# Patient Record
Sex: Female | Born: 1960 | Race: White | Hispanic: No | Marital: Married | Smoking: Former smoker
Health system: Southern US, Community
[De-identification: ages and names within clinical notes are randomized; demographics above are authoritative.]

## PROBLEM LIST (undated history)

## (undated) DIAGNOSIS — I10 Essential (primary) hypertension: Secondary | ICD-10-CM

## (undated) DIAGNOSIS — M1990 Unspecified osteoarthritis, unspecified site: Secondary | ICD-10-CM

## (undated) DIAGNOSIS — K649 Unspecified hemorrhoids: Secondary | ICD-10-CM

## (undated) HISTORY — PX: TUBAL LIGATION: SHX77

## (undated) HISTORY — DX: Unspecified hemorrhoids: K64.9

## (undated) HISTORY — PX: HEMORRHOID SURGERY: SHX153

## (undated) HISTORY — PX: CHOLECYSTECTOMY: SHX55

## (undated) HISTORY — PX: DILATION AND CURETTAGE OF UTERUS: SHX78

## (undated) HISTORY — PX: TONSILLECTOMY: SUR1361

---

## 2009-04-14 ENCOUNTER — Emergency Department: Payer: Self-pay | Admitting: Emergency Medicine

## 2009-04-22 ENCOUNTER — Ambulatory Visit: Payer: Self-pay

## 2009-04-24 ENCOUNTER — Ambulatory Visit: Payer: Self-pay

## 2011-06-21 ENCOUNTER — Ambulatory Visit: Payer: Self-pay | Admitting: Family Medicine

## 2013-01-28 ENCOUNTER — Observation Stay: Payer: Self-pay | Admitting: Surgery

## 2013-01-29 LAB — CBC WITH DIFFERENTIAL/PLATELET
Basophil #: 0.1 10*3/uL (ref 0.0–0.1)
Basophil %: 0.8 %
Eosinophil %: 4.7 %
HCT: 35.9 % (ref 35.0–47.0)
HGB: 12.5 g/dL (ref 12.0–16.0)
Lymphocyte %: 29.2 %
MCV: 94 fL (ref 80–100)
Neutrophil %: 55.8 %
Platelet: 222 10*3/uL (ref 150–440)
RDW: 12.9 % (ref 11.5–14.5)

## 2013-01-29 LAB — BASIC METABOLIC PANEL
Anion Gap: 3 — ABNORMAL LOW (ref 7–16)
BUN: 14 mg/dL (ref 7–18)
Calcium, Total: 8.4 mg/dL — ABNORMAL LOW (ref 8.5–10.1)
EGFR (African American): >60
Glucose: 107 mg/dL — ABNORMAL HIGH (ref 65–99)
Osmolality: 280 (ref 275–301)
Potassium: 3.7 mmol/L (ref 3.5–5.1)
Sodium: 140 mmol/L (ref 136–145)

## 2013-02-08 ENCOUNTER — Ambulatory Visit: Payer: Self-pay | Admitting: Family Medicine

## 2014-08-16 NOTE — Discharge Summary (Signed)
PATIENT NAME:  Alexis Allen, Alexis Allen MR#:  [id number] DATE OF BIRTH:  10/24/1960  DATE OF ADMISSION:  01/28/2013 DATE OF DISCHARGE:  01/31/2013  FINAL DIAGNOSIS: Thrombosed prolapsed internal and external hemorrhoids.   PRINCIPAL PROCEDURE: Open hemorrhoidectomy on October 6th.   HOSPITAL COURSE SUMMARY: The patient was admitted initially with severe perianal pain and bleeding. She was brought to the operating room the next day for hemorrhoidectomy and examination under anesthesia. She tolerated this well. On postoperative day #1, the patient was doing very well. Rectal packing was removed. On postoperative day #2, the patient had an expected amount of edema around the operative site. No signs of active infection or bleeding, and she was stable and improved for discharge. I will see her in the office in 7 days' time. Medications can be found on the reconciliation form.    ____________________________ Redge GainerMark A. Egbert GaribaldiBird, MD mab:gb D: 02/15/2013 19:34:22 ET T: 02/15/2013 21:32:33 ET JOB#: [id number]  cc: Loraine LericheMark A. Egbert GaribaldiBird, MD, <Dictator> Raynald KempMARK A Lilo Wallington MD ELECTRONICALLY SIGNED 02/15/2013 21:53

## 2014-08-16 NOTE — Op Note (Signed)
PATIENT NAME:  Alexis Allen, [name] MR#:  [id number] DATE OF BIRTH:  Dec 22, 1960  DATE OF PROCEDURE:  01/29/2013  PREOPERATIVE DIAGNOSIS: Prolapsed and incarcerated internal external hemorrhoids.   POSTOPERATIVE DIAGNOSIS: Prolapsed and incarcerated internal external hemorrhoids.   PROCEDURE PERFORMED: Right lateral internal external hemorrhoidectomy, open technique. Examination under anesthesia.   SURGEON: Natale LayMark Cameo Shewell, M.D. FACS  ANESTHESIA: General endotracheal.   FINDINGS: As above.   SPECIMENS: As above.   ESTIMATED BLOOD LOSS: Minimal.   DESCRIPTION OF PROCEDURE: With informed consent, supine position, general oral endotracheal anesthesia was induced. The patient was then padded and positioned in prone jackknife position with appropriate precautions. Buttock cheeks were taped apart with tape. Prep was performed with Betadine. Timeout was observed.   Digital rectal examination demonstrated a column of internal external hemorrhoids which had prolapsed on the right lateral aspect. A total of 20 mL of 0.25% Marcaine with epinephrine was slowly instilled into the area of the hemorrhoidal tissue and circumferentially. Gentle manual pressure for approximately 10 minutes was able to reduce this back into the anal canal. At this point, duckbill anoscope was then inserted, four quadrant inspection was undertaken. On the right lateral aspect was an area of necrotic mucosa. This was along the lateral aspect. A 3-0 chromic was placed at the apex of the hemorrhoidal pile in a figure-of-eight fashion and tied. A diamond-shaped hemorrhoidectomy was achieved utilizing electrocautery and the application of the focus Harmonic scalpel. A good portion of the external hemorrhoid was also excised and sent as specimen. Hemostasis appeared to be adequate on the operative field. The sphincter mechanism was intact at the completion of the resection and the wound was then closed utilizing a running locking 3-0 chromic suture.   Distal aspect was left open for future drainage.   Hemostasis appeared to be adequate on the operative field. 1% bupivacaine ointment was instilled into the rectum utilizing a portion of vaginal packing. A sterile dressing was then applied consisting of ABDs and mesh surgical underwear. The patient was then returned supine, extubated and taken to the recovery room in stable and satisfactory condition by anesthesia services.     ____________________________ Redge GainerMark A. Egbert GaribaldiBird, MD mab:sg D: 01/29/2013 11:44:08 ET T: 01/29/2013 12:07:25 ET JOB#: [id number]  cc: Loraine LericheMark A. Egbert GaribaldiBird, MD, <Dictator> Raynald KempMARK A Tihanna Goodson MD ELECTRONICALLY SIGNED 02/04/2013 16:45

## 2014-08-16 NOTE — H&P (Signed)
   Subjective/Chief Complaint Perianal pain, unreducible hemorrhoid   History of Present Illness [name] is Allen pleasant 54 yo F with Allen history of intermittently prolapsing but reducible internal hemorrhoids who presents with 1 day of perianal pain and inability to reduce her hemorrhoid.  Has been placing hemorroidal cream and wipes on it and it hasnt worked.  Also with some blood on the toilet paper.  Has not tried baths.  Has regular bm, no recent or chronic constipation.  + subjective chillls   Past History HTN H/o cholecystectomy H/o tonsillectomy H/o D and C   Past Medical Health Hypertension, Smoking   Past Med/Surgical Hx:  Hypertension:   Denies medical history:   Cholecystectomy:   D&C - Dilation and Curretage:   Hysterectomy - Total:   ALLERGIES:  No Known Allergies:   Family and Social History:  Family History Hypertension  Cancer  Stroke   Social History positive  tobacco, negative ETOH, negative Illicit drugs, 1/2 ppd   + Tobacco Current (within 1 year)  [place] in [place]   Review of Systems:  Subjective/Chief Complaint Perianal pain, prolapsed incarcerated internal hemorrhoid   Fever/Chills Yes   Cough No   Sputum No   Abdominal Pain No   Diarrhea No   Constipation No   Nausea/Vomiting No   SOB/DOE No   Chest Pain No   Dysuria No   Physical Exam:  GEN well developed, Appears uncomfortable   HEENT pale conjunctivae, PERRL, good dentition   RESP normal resp effort  clear BS  no use of accessory muscles   CARD regular rate   ABD denies tenderness  denies Flank Tenderness  soft  normal BS  no Abdominal Bruits  no Adominal Mass  + incarcerated, prolapsed internal hemorrhoid with some necrosis   EXTR negative cyanosis/clubbing, negative edema   SKIN normal to palpation, No rashes, No ulcers, skin turgor good   NEURO cranial nerves intact, negative rigidity, negative tremor   PSYCH alert, Allen+O to time, place, person, good insight, agitated     Assessment/Admission Diagnosis [name] is Allen pleasant 54 yo F with Allen prolapsed, incarcerated internal hemorrhoid with some necrosis.   Plan Will admit for pain control.  Will discuss with oncoming surgeon regarding probable hemorrhoidectomy.   Electronic Signatures: Alexis Allen, Alexis Allen (MD)  (Signed 05-Oct-14 19:30)  Authored: CHIEF COMPLAINT and HISTORY, PAST MEDICAL/SURGIAL HISTORY, ALLERGIES, FAMILY AND SOCIAL HISTORY, REVIEW OF SYSTEMS, PHYSICAL EXAM, ASSESSMENT AND PLAN   Last Updated: 05-Oct-14 19:30 by Alexis Allen, Alexis Allen (MD)

## 2015-01-13 ENCOUNTER — Ambulatory Visit: Payer: Self-pay | Attending: Oncology

## 2015-05-28 ENCOUNTER — Ambulatory Visit: Payer: Self-pay | Attending: Oncology

## 2015-05-28 ENCOUNTER — Ambulatory Visit
Admission: RE | Admit: 2015-05-28 | Discharge: 2015-05-28 | Disposition: A | Discharge status: Home / Self Care | Payer: Self-pay | Source: Ambulatory Visit | Attending: Oncology | Admitting: Oncology

## 2015-05-28 VITALS — BP 159/86 | HR 63 | Temp 98.7°F | Resp 20 | Ht 64.0 in | Wt 153.0 lb

## 2015-05-28 DIAGNOSIS — Z0000 Encounter for general adult medical examination without abnormal findings: Secondary | ICD-10-CM

## 2015-05-28 NOTE — Progress Notes (Signed)
Subjective:     Patient ID: Alexis Allen, female   DOB: March 26, 1961, 55 y.o.   MRN: [id number]  HPI   Review of Systems     Objective:   Physical Exam  Pulmonary/Chest: Right breast exhibits no inverted nipple, no mass, no nipple discharge, no skin change and no tenderness. Left breast exhibits no inverted nipple, no mass, no nipple discharge, no skin change and no tenderness. Breasts are symmetrical.       Assessment:     55 year old patient presents for [facility] clinic visit.  Patient screened, and meets BCCCP eligibility.  Patient does not have insurance, Medicare or Medicaid.  Handout given on Affordable Care Act. Instructed patient on breast self-exam using teach back method.  CBE unremarkable. No mass or lump palpated.  Patient recently started  An increased dose of her blood pressure medication.  Educated on rationale for medication, and taking medication at same time.  Patient to monitor blood pressure daily, and report results to primary care physician.    Plan:     Sent for bilateral screening mammogram.

## 2015-05-30 NOTE — Progress Notes (Signed)
Letter mailed from [facility] to notify of normal mammogram results.  Patient to return in one year for annual screening.  Copy to [facility]. 

## 2015-06-19 ENCOUNTER — Emergency Department: Payer: Self-pay

## 2015-06-19 ENCOUNTER — Emergency Department
Admission: EM | Admit: 2015-06-19 | Discharge: 2015-06-19 | Disposition: A | Discharge status: Home / Self Care | Payer: Self-pay | Attending: Emergency Medicine | Admitting: Emergency Medicine

## 2015-06-19 DIAGNOSIS — I10 Essential (primary) hypertension: Secondary | ICD-10-CM | POA: Insufficient documentation

## 2015-06-19 DIAGNOSIS — R519 Headache, unspecified: Secondary | ICD-10-CM

## 2015-06-19 DIAGNOSIS — F17210 Nicotine dependence, cigarettes, uncomplicated: Secondary | ICD-10-CM | POA: Insufficient documentation

## 2015-06-19 DIAGNOSIS — R51 Headache: Secondary | ICD-10-CM | POA: Insufficient documentation

## 2015-06-19 HISTORY — DX: Essential (primary) hypertension: I10

## 2015-06-19 LAB — URINALYSIS COMPLETE WITH MICROSCOPIC (ARMC ONLY)
BILIRUBIN URINE: NEGATIVE
Bacteria, UA: NONE SEEN
Glucose, UA: NEGATIVE mg/dL
KETONES UR: NEGATIVE mg/dL
Leukocytes, UA: NEGATIVE
Nitrite: NEGATIVE
Protein, ur: NEGATIVE mg/dL
Specific Gravity, Urine: 1.013 (ref 1.005–1.030)
WBC, UA: NONE SEEN WBC/hpf (ref 0–5)
pH: 6 (ref 5.0–8.0)

## 2015-06-19 LAB — BASIC METABOLIC PANEL
Anion gap: 8 (ref 5–15)
BUN: 14 mg/dL (ref 6–20)
CALCIUM: 9.4 mg/dL (ref 8.9–10.3)
CO2: 30 mmol/L (ref 22–32)
CREATININE: 0.62 mg/dL (ref 0.44–1.00)
Chloride: 107 mmol/L (ref 101–111)
GFR calc non Af Amer: >60 mL/min (ref >60)
Glucose, Bld: 97 mg/dL (ref 65–99)
Potassium: 4 mmol/L (ref 3.5–5.1)
Sodium: 145 mmol/L (ref 135–145)

## 2015-06-19 LAB — CBC
HCT: 39 % (ref 35.0–47.0)
Hemoglobin: 13.4 g/dL (ref 12.0–16.0)
MCH: 31.3 pg (ref 26.0–34.0)
MCHC: 34.3 g/dL (ref 32.0–36.0)
MCV: 91.2 fL (ref 80.0–100.0)
PLATELETS: 241 10*3/uL (ref 150–440)
RBC: 4.28 MIL/uL (ref 3.80–5.20)
RDW: 13.1 % (ref 11.5–14.5)
WBC: 10.5 10*3/uL (ref 3.6–11.0)

## 2015-06-19 MED ORDER — LIDOCAINE-EPINEPHRINE (PF) 1 %-1:200000 IJ SOLN
INTRAMUSCULAR | Status: AC
  Filled 2015-06-19: qty 30

## 2015-06-19 MED ORDER — METOCLOPRAMIDE HCL 5 MG/ML IJ SOLN
10.0000 mg | Freq: Once | INTRAMUSCULAR | Status: AC
  Administered 2015-06-19: 10 mg via INTRAVENOUS
  Filled 2015-06-19 (×2): qty 2

## 2015-06-19 MED ORDER — DIPHENHYDRAMINE HCL 50 MG/ML IJ SOLN
25.0000 mg | Freq: Once | INTRAMUSCULAR | Status: AC
  Administered 2015-06-19: 25 mg via INTRAVENOUS
  Filled 2015-06-19: qty 1

## 2015-06-19 MED ORDER — SODIUM CHLORIDE 0.9 % IV SOLN
Freq: Once | INTRAVENOUS | Status: AC
  Administered 2015-06-19: 15:00:00 via INTRAVENOUS

## 2015-06-19 MED ORDER — KETOROLAC TROMETHAMINE 30 MG/ML IJ SOLN
30.0000 mg | Freq: Once | INTRAMUSCULAR | Status: AC
  Administered 2015-06-19: 30 mg via INTRAVENOUS
  Filled 2015-06-19: qty 1

## 2015-06-19 NOTE — Discharge Instructions (Signed)

## 2015-06-19 NOTE — ED Notes (Signed)
Says bp up all day and sever headache.  Pt says light does not affect it.

## 2015-06-19 NOTE — ED Provider Notes (Signed)
[facility] Emergency Department Provider Note     Time seen: ----------------------------------------- 2:26 PM on 06/19/2015 -----------------------------------------    I have reviewed the triage vital signs and the nursing notes.   HISTORY  Chief Complaint Hypertension and Headache    HPI Alexis Allen is a 55 y.o. female who presents to the ER for several weeks of increasing blood pressure and several days of headache. Patient states her blood pressures been elevated all day and she's had a severe headache. Patient states light does not effect it, she also states she is under increased stress. She does have some nausea associated with her symptoms. Nothing makes it better or worse.   Past Medical History  Diagnosis Date  . Hypertension     There are no active problems to display for this patient.   Past Surgical History  Procedure Laterality Date  . Tubal ligation    . Cholecystectomy    . Dilation and curettage of uterus    . Tonsillectomy      Allergies Review of patient's allergies indicates no known allergies.  Social History Social History  Substance Use Topics  . Smoking status: Current Some Day Smoker    Types: Cigarettes  . Smokeless tobacco: None  . Alcohol Use: No    Review of Systems Constitutional: Negative for fever. Eyes: Negative for visual changes. ENT: Negative for sore throat. Cardiovascular: Negative for chest pain. Respiratory: Negative for shortness of breath. Gastrointestinal: Negative for abdominal pain, positive for nausea Genitourinary: Negative for dysuria. Musculoskeletal: Negative for back pain. Skin: Negative for rash. Neurological: Positive for headache  10-point ROS otherwise negative.  ____________________________________________   PHYSICAL EXAM:  VITAL SIGNS: ED Triage Vitals  Enc Vitals Group     BP 06/19/15 1019 160/90 mmHg     Pulse Rate 06/19/15 1019 76     Resp 06/19/15 1019  18     Temp 06/19/15 1019 97.9 F (36.6 C)     Temp Source 06/19/15 1019 Oral     SpO2 06/19/15 1019 99 %     Weight 06/19/15 1019 153 lb (69.4 kg)     Height 06/19/15 1019  (1.626 m)     Head Cir --      Peak Flow --      Pain Score 06/19/15 1028 8     Pain Loc --      Pain Edu? --      Excl. in GC? --     Constitutional: Alert and oriented. Well appearing and in no distress. Eyes: Conjunctivae are normal. PERRL. Normal extraocular movements. No photophobia ENT   Head: Normocephalic and atraumatic.   Nose: No congestion/rhinnorhea.   Mouth/Throat: Mucous membranes are moist.   Neck: No stridor. No meningeal signs  Cardiovascular: Normal rate, regular rhythm. Normal and symmetric distal pulses are present in all extremities. No murmurs, rubs, or gallops. Respiratory: Normal respiratory effort without tachypnea nor retractions. Breath sounds are clear and equal bilaterally. No wheezes/rales/rhonchi. Gastrointestinal: Soft and nontender. No distention. No abdominal bruits.  Musculoskeletal: Nontender with normal range of motion in all extremities. No joint effusions.  No lower extremity tenderness nor edema. Neurologic:  Normal speech and language. No gross focal neurologic deficits are appreciated. Speech is normal. No gait instability. Skin:  Skin is warm, dry and intact. No rash noted. Psychiatric: Mood and affect are normal. Speech and behavior are normal. Patient exhibits appropriate insight and judgment. ____________________________________________  ED COURSE:  Pertinent labs & imaging results  that were available during my care of the patient were reviewed by me and considered in my medical decision making (see chart for details). Patient is in no acute distress, states this is a severe headache, we'll obtain CT imaging and give an IV headache cocktail. ____________________________________________    LABS (pertinent positives/negatives)  Labs Reviewed   URINALYSIS COMPLETEWITH MICROSCOPIC ([facility] ONLY) - Abnormal; Notable for the following:    Color, Urine YELLOW (*)    APPearance CLEAR (*)    Hgb urine dipstick 3+ (*)    Squamous Epithelial / LPF 0-5 (*)    All other components within normal limits  BASIC METABOLIC PANEL  CBC    RADIOLOGY Images were viewed by me  CT head IMPRESSION: Negative head CT. ____________________________________________  FINAL ASSESSMENT AND PLAN  Headache, hypertension  Plan: Patient with labs and imaging as dictated above. Headache and blood pressure are improved at this time. She is in no acute distress and feeling better. She stable for outpatient follow-up with her doctor.   Emily Filbert, MD   Emily Filbert, MD 06/19/15 [phone]

## 2015-06-19 NOTE — ED Notes (Signed)
Pt c/o b/p more elevated then normal in the past couple of weeks, takes medication for HTN..states today is worse with a HA and nausea.

## 2015-09-07 ENCOUNTER — Emergency Department
Admission: EM | Admit: 2015-09-07 | Discharge: 2015-09-07 | Disposition: A | Discharge status: Home / Self Care | Payer: Self-pay | Attending: Emergency Medicine | Admitting: Emergency Medicine

## 2015-09-07 DIAGNOSIS — I10 Essential (primary) hypertension: Secondary | ICD-10-CM | POA: Insufficient documentation

## 2015-09-07 DIAGNOSIS — K649 Unspecified hemorrhoids: Secondary | ICD-10-CM | POA: Insufficient documentation

## 2015-09-07 DIAGNOSIS — Z87891 Personal history of nicotine dependence: Secondary | ICD-10-CM | POA: Insufficient documentation

## 2015-09-07 MED ORDER — LIDOCAINE-EPINEPHRINE 2 %-1:100000 IJ SOLN
20.0000 mL | Freq: Once | INTRAMUSCULAR | Status: AC
  Administered 2015-09-07: 20 mL
  Filled 2015-09-07: qty 20

## 2015-09-07 MED ORDER — OXYCODONE-ACETAMINOPHEN 5-325 MG PO TABS: 1.0000 | ORAL_TABLET | Freq: Four times a day (QID) | ORAL | Status: DC | PRN

## 2015-09-07 MED ORDER — LIDOCAINE-EPINEPHRINE (PF) 1 %-1:200000 IJ SOLN
INTRAMUSCULAR | Status: AC
  Filled 2015-09-07: qty 30

## 2015-09-07 MED ORDER — HYDROCORTISONE ACETATE 25 MG RE SUPP: 25.0000 mg | Freq: Two times a day (BID) | RECTAL | Status: DC

## 2015-09-07 MED ORDER — OXYCODONE-ACETAMINOPHEN 5-325 MG PO TABS
1.0000 | ORAL_TABLET | Freq: Once | ORAL | Status: AC
  Administered 2015-09-07: 1 via ORAL
  Filled 2015-09-07: qty 1

## 2015-09-07 NOTE — ED Provider Notes (Signed)
[facility] Emergency Department Provider Note  ____________________________________________   I have reviewed the triage vital signs and the nursing notes.   HISTORY  Chief Complaint Hemorrhoids    HPI Alexis Allen is a 55 y.o. female presents today complaining of a hemorrhoid. History of hemorrhoidectomy in 2015. Has had hemorrhoids for last few days. Is using a cream from the [employer]. Is not using Anusol or steroids. States the pain is persistent. No significant bleeding. No constipation she is on stool softeners. with no other complaints      Past Medical History  Diagnosis Date  . Hypertension     There are no active problems to display for this patient.   Past Surgical History  Procedure Laterality Date  . Tubal ligation    . Cholecystectomy    . Dilation and curettage of uterus    . Tonsillectomy    . Hemorrhoid surgery      No current outpatient prescriptions on file.  Allergies Review of patient's allergies indicates no known allergies.  Family History  Problem Relation Age of Onset  . Breast cancer Neg Hx     Social History Social History  Substance Use Topics  . Smoking status: Former Smoker    Types: Cigarettes    Quit date: 06/25/2015  . Smokeless tobacco: None  . Alcohol Use: No    Review of Systems }Constitutional: No fever/chills Eyes: No visual changes. ENT: No sore throat. No stiff neck no neck pain Cardiovascular: Denies chest pain. Respiratory: Denies shortness of breath. Gastrointestinal:   no vomiting.  No diarrhea.  No constipation. Genitourinary: Negative for dysuria. Musculoskeletal: Negative lower extremity swelling Skin: Negative for rash. Neurological: Negative for headaches, focal weakness or numbness. 10-point ROS otherwise negative.  ____________________________________________   PHYSICAL EXAM:  VITAL SIGNS: ED Triage Vitals  Enc Vitals Group     BP 09/07/15 1316 141/83 mmHg    Pulse Rate 09/07/15 1316 84     Resp 09/07/15 1316 17     Temp 09/07/15 1316 98.6 F (37 C)     Temp Source 09/07/15 1316 Oral     SpO2 09/07/15 1316 96 %     Weight 09/07/15 1316 150 lb (68.04 kg)     Height 09/07/15 1316  (1.626 m)     Head Cir --      Peak Flow --      Pain Score 09/07/15 1316 10     Pain Loc --      Pain Edu? --      Excl. in GC? --     Constitutional: Alert and oriented. Well appearing and in no acute distress. Eyes: Conjunctivae are normal. PERRL. EOMI. Head: Atraumatic. Nose: No congestion/rhinnorhea. Mouth/Throat: Mucous membranes are moist.  Oropharynx non-erythematous. Neck: No stridor.   Nontender with no meningismus Cardiovascular: Normal rate, regular rhythm. Grossly normal heart sounds.  Good peripheral circulation. Respiratory: Normal respiratory effort.  No retractions. Lungs CTAB. Abdominal: Soft and nontender. No distention. No guarding no rebound Back:  There is no focal tenderness or step off there is no midline tenderness there are no lesions noted. there is no CVA tenderness Rectal exam: Female nurse was in the room, patient does have a nonthrombosed external hemorrhoid which is tender but not infected in appearance on her rectum. This approximately 1.4 cm and bifurcated  Musculoskeletal: No lower extremity tenderness. No joint effusions, no DVT signs strong distal pulses no edema Neurologic:  Normal speech and language. No gross  focal neurologic deficits are appreciated.  Skin:  Skin is warm, dry and intact. No rash noted. Psychiatric: Mood and affect are normal. Speech and behavior are normal.  ____________________________________________   LABS (all labs ordered are listed, but only abnormal results are displayed)  Labs Reviewed - No data to display ____________________________________________  EKG  I personally interpreted any EKGs ordered by me or triage  ____________________________________________  RADIOLOGY  I  reviewed any imaging ordered by me or triage that were performed during my shift and, if possible, patient and/or family made aware of any abnormal findings. ____________________________________________   PROCEDURES  Procedure(s) performed: After consent from patient and sterilizing the area with Betadine, I did inject 2% lidocaine with epi, 3 cc, into the hemorrhoids see if we could reduce the discomfort. Patient tolerated the procedure well with application  Critical Care performed: None  ____________________________________________   INITIAL IMPRESSION / ASSESSMENT AND PLAN / ED COURSE  Pertinent labs & imaging results that were available during my care of the patient were reviewed by me and considered in my medical decision making (see chart for details).  Patient with a hemorrhoid. No evidence of significant complication. She states she was at "agonizing" pain from the hemorrhoids. We will give her pain medication for this Anusol and close outpatient follow-up with her surgeon. No evidence of any acute, patient at this time. She is requesting a work note.. ____________________________________________   FINAL CLINICAL IMPRESSION(S) / ED DIAGNOSES  Final diagnoses:  None      This chart was dictated using voice recognition software.  Despite best efforts to proofread,  errors can occur which can change meaning.     Jeanmarie PlantJames A Jolaine Fryberger, MD 09/07/15 [phone]

## 2015-09-07 NOTE — Discharge Instructions (Signed)
Hemorrhoids  Hemorrhoids are puffy (swollen) veins around the rectum or anus. Hemorrhoids can cause pain, itching, bleeding, or irritation.  HOME CARE  · Eat foods with fiber, such as whole grains, beans, nuts, fruits, and vegetables. Ask your doctor about taking products with added fiber in them (fiber supplements).   · Drink enough fluid to keep your pee (urine) clear or pale yellow.  · Exercise often.  · Go to the bathroom when you have the urge to poop. Do not wait.  · Avoid straining to poop (bowel movement).  · Keep the butt area dry and clean. Use wet toilet paper or moist paper towels.  · Medicated creams and medicine inserted into the anus (anal suppository) may be used or applied as told.  · Only take medicine as told by your doctor.  · Take a warm water bath (sitz bath) for 15-20 minutes to ease pain. Do this 3-4 times a day.  · Place ice packs on the area if it is tender or puffy. Use the ice packs between the warm water baths.    Put ice in a plastic bag.    Place a towel between your skin and the bag.    Leave the ice on for 15-20 minutes, 03-04 times a day.  · Do not use a donut-shaped pillow or sit on the toilet for a long time.  GET HELP RIGHT AWAY IF:   · You have more pain that is not controlled by treatment or medicine.  · You have bleeding that will not stop.  · You have trouble or are unable to poop (bowel movement).  · You have pain or puffiness outside the area of the hemorrhoids.  MAKE SURE YOU:   · Understand these instructions.  · Will watch your condition.  · Will get help right away if you are not doing well or get worse.     This information is not intended to replace advice given to you by your health care provider. Make sure you discuss any questions you have with your health care provider.     Document Released: 01/20/2008 Document Revised: 03/29/2012 Document Reviewed: 02/22/2012  Elsevier Interactive Patient Education ©2016 [employer]

## 2015-09-07 NOTE — ED Notes (Signed)
Hx of hemorrhoidectomy in 2015. States hemorrhoid problem started on Thursday. Has been trying to treat at home but pain became too great today.

## 2015-09-09 ENCOUNTER — Emergency Department
Admission: EM | Admit: 2015-09-09 | Discharge: 2015-09-09 | Disposition: A | Discharge status: Home / Self Care | Payer: Self-pay | Attending: Emergency Medicine | Admitting: Emergency Medicine

## 2015-09-09 ENCOUNTER — Telehealth: Payer: Self-pay | Admitting: Surgery

## 2015-09-09 DIAGNOSIS — K648 Other hemorrhoids: Secondary | ICD-10-CM | POA: Insufficient documentation

## 2015-09-09 DIAGNOSIS — I10 Essential (primary) hypertension: Secondary | ICD-10-CM | POA: Insufficient documentation

## 2015-09-09 DIAGNOSIS — Z87891 Personal history of nicotine dependence: Secondary | ICD-10-CM | POA: Insufficient documentation

## 2015-09-09 MED ORDER — HYDROMORPHONE HCL 1 MG/ML IJ SOLN
0.5000 mg | Freq: Once | INTRAMUSCULAR | Status: AC
  Administered 2015-09-09: 0.5 mg via INTRAMUSCULAR
  Filled 2015-09-09: qty 1

## 2015-09-09 MED ORDER — OXYCODONE-ACETAMINOPHEN 5-325 MG PO TABS: 1.0000 | ORAL_TABLET | ORAL | Status: DC | PRN

## 2015-09-09 MED ORDER — BENZOCAINE 20 % RE OINT: TOPICAL_OINTMENT | RECTAL | Status: DC | PRN

## 2015-09-09 MED ORDER — HYDROCORTISONE 2.5 % EX CREA: TOPICAL_CREAM | Freq: Two times a day (BID) | CUTANEOUS | Status: DC

## 2015-09-09 NOTE — Discharge Instructions (Signed)

## 2015-09-09 NOTE — Telephone Encounter (Signed)
I have the patient scheduled for an appointment Thursday. She has already been to the ER and is doing what they told her to do but in pain. She wants to know if she can come in earlier? She may go back to the ED today.

## 2015-09-09 NOTE — Telephone Encounter (Signed)
Patient called and stated that she has went to the ED today. She was given medication to help with the pain until her appointment with Dr Everlene FarrierPabon on 09/11/15.

## 2015-09-09 NOTE — ED Notes (Signed)
Pt states she was seen here 2 days ago for hemorrhoid pain, states she has an apt Thursday afternoon withy a Careers advisersurgeon but states the pain is too severe..Marland Kitchen

## 2015-09-09 NOTE — ED Provider Notes (Signed)
[facility] Emergency Department Provider Note  ____________________________________________  Time seen: Approximately 12:04 PM  I have reviewed the triage vital signs and the nursing notes.   HISTORY  Chief Complaint Hemorrhoids    HPI Alexis Allen is a 55 y.o. female presents for evaluation of hemorrhoidal pain. Patient states that she was here 2 days ago and states that the pain is getting worse. Has appointment in 2 more days on Thursday to see the surgeon but desires some relief prior to then.   Past Medical History  Diagnosis Date  . Hypertension     There are no active problems to display for this patient.   Past Surgical History  Procedure Laterality Date  . Tubal ligation    . Cholecystectomy    . Dilation and curettage of uterus    . Tonsillectomy    . Hemorrhoid surgery      Current Outpatient Rx  Name  Route  Sig  Dispense  Refill  . benzocaine (AMERICAINE) 20 % rectal ointment   Rectal   Place rectally every 3 (three) hours as needed for pain.   28.4 g   0     May substitute for OTC lidocaine ointment/cream   . hydrocortisone 2.5 % cream   Topical   Apply topically 2 (two) times daily.   15 g   2   . oxyCODONE-acetaminophen (ROXICET) 5-325 MG tablet   Oral   Take 1-2 tablets by mouth every 4 (four) hours as needed for severe pain.   15 tablet   0     Allergies Review of patient's allergies indicates no known allergies.  Family History  Problem Relation Age of Onset  . Breast cancer Neg Hx     Social History Social History  Substance Use Topics  . Smoking status: Former Smoker    Types: Cigarettes    Quit date: 06/25/2015  . Smokeless tobacco: None  . Alcohol Use: No    Review of Systems Constitutional: No fever/chills Cardiovascular: Denies chest pain. Respiratory: Denies shortness of breath. Gastrointestinal: Positive for hemorrhoids. Genitourinary: Negative for dysuria. Musculoskeletal: Negative  for back pain. Skin: Negative for rash. Neurological: Negative for headaches, focal weakness or numbness.  10-point ROS otherwise negative.  ____________________________________________   PHYSICAL EXAM:  VITAL SIGNS: ED Triage Vitals  Enc Vitals Group     BP 09/09/15 1155 118/78 mmHg     Pulse Rate 09/09/15 1155 86     Resp 09/09/15 1155 17     Temp 09/09/15 1155 98.1 F (36.7 C)     Temp Source 09/09/15 1155 Oral     SpO2 09/09/15 1155 99 %     Weight 09/09/15 1155 150 lb (68.04 kg)     Height 09/09/15 1155 5\' 4"  (1.626 m)     Head Cir --      Peak Flow --      Pain Score 09/09/15 1156 10     Pain Loc --      Pain Edu? --      Excl. in GC? --     Constitutional: Alert and oriented. Well appearing and in no acute distress.   Cardiovascular: Normal rate, regular rhythm. Grossly normal heart sounds.  Good peripheral circulation. Respiratory: Normal respiratory effort.  No retractions. Lungs CTAB. Gastrointestinal: Soft and nontender. No distention. No abdominal bruits. No CVA tenderness.   Rectum: Rectal exam: Female nurse was in the room, patient does have a nonthrombosed external hemorrhoids. Neurologic:  Normal speech  and language. No gross focal neurologic deficits are appreciated. No gait instability. Skin:  Skin is warm, dry and intact. No rash noted. Psychiatric: Mood and affect are normal. Speech and behavior are normal.  ____________________________________________   LABS (all labs ordered are listed, but only abnormal results are displayed)  Labs Reviewed - No data to display ____________________________________________   PROCEDURES  Procedure(s) performed: None  Critical Care performed: No  ____________________________________________   INITIAL IMPRESSION / ASSESSMENT AND PLAN / ED COURSE  Pertinent labs & imaging results that were available during my care of the patient were reviewed by me and considered in my medical decision making (see chart  for details).  Multiple hemorrhoids noted to the anal area. Patient was given hydrocortisone cream 2.5% to use twice a day Americaine or benzocaine creams to help numb the hemorrhoids. Rx given for refill Percocet spoke with the surgeon's office and patient is to keep her appointment in 2 days. ____________________________________________   FINAL CLINICAL IMPRESSION(S) / ED DIAGNOSES  Final diagnoses:  Hemorrhoid prolapse     This chart was dictated using voice recognition software/Dragon. Despite best efforts to proofread, errors can occur which can change the meaning. Any change was purely unintentional.   Evangeline Dakin, PA-C 09/09/15 1505  Sharman Cheek, MD 09/10/15 [phone]

## 2015-09-11 ENCOUNTER — Encounter: Payer: Self-pay | Admitting: Surgery

## 2015-09-11 ENCOUNTER — Ambulatory Visit (INDEPENDENT_AMBULATORY_CARE_PROVIDER_SITE_OTHER): Payer: Self-pay | Admitting: Surgery

## 2015-09-11 VITALS — BP 116/77 | HR 70 | Temp 98.3°F | Ht 64.0 in | Wt 149.0 lb

## 2015-09-11 DIAGNOSIS — K648 Other hemorrhoids: Secondary | ICD-10-CM

## 2015-09-11 DIAGNOSIS — K643 Fourth degree hemorrhoids: Secondary | ICD-10-CM

## 2015-09-11 MED ORDER — OXYCODONE-ACETAMINOPHEN 5-325 MG PO TABS: 1.0000 | ORAL_TABLET | ORAL | Status: DC | PRN

## 2015-09-11 NOTE — Patient Instructions (Signed)
Please take Aleve as well as your pain medication.  Continue to do sitz baths four times a day for a period of 10-15 minutes.  We will do your surgery next week on Thursday Sep 19, 2015. Your surgeon will be Dr[facility] Ricarda [facility]harles [facility].

## 2015-09-12 ENCOUNTER — Telehealth: Payer: Self-pay | Admitting: General Surgery

## 2015-09-12 DIAGNOSIS — K643 Fourth degree hemorrhoids: Secondary | ICD-10-CM | POA: Insufficient documentation

## 2015-09-12 NOTE — Progress Notes (Signed)
Patient ID: Alexis Allen, female   DOB: 03/16/1961, 55 y.o.   MRN: [id number]  History of Present Illness Alexis Allen is a 55 y.o. female evaluated for severe anorectal pain. She reports that she's been having this pain for at least week is sharp is intermittent and over the last 2 days it's unbearable. She has had some intermittent hematochezia only when she wipes and after having a bowel movement. Her pain is worsening when she sits down and when she has a bowel movement. Of note Dr.Byrd dated and hemorrhoidectomy couple of years ago with good results. She was evaluated in the emergency room 2 days ago and was given a topical cream and Anusol suppositories. Unfortunately she could not afford the Anusol suppositories and cream still not helping her. She has been doing some sitz baths and stool softener but she has failed this measurements and she is miserable  Past Medical History Past Medical History  Diagnosis Date  . Hypertension   . Hemorrhoids       Past Surgical History  Procedure Laterality Date  . Tubal ligation    . Cholecystectomy    . Dilation and curettage of uterus    . Tonsillectomy    . Hemorrhoid surgery      No Known Allergies  Current Outpatient Prescriptions  Medication Sig Dispense Refill  . oxyCODONE-acetaminophen (ROXICET) 5-325 MG tablet Take 1-2 tablets by mouth every 4 (four) hours as needed for severe pain. 30 tablet 0   No current facility-administered medications for this visit.    Family History Family History  Problem Relation Age of Onset  . Breast cancer Neg Hx   . Cancer Mother     ovarian  . Hypertension Father   . Stroke Father      Social History Social History  Substance Use Topics  . Smoking status: Former Smoker    Types: Cigarettes    Quit date: 06/25/2015  . Smokeless tobacco: None  . Alcohol Use: No      ROS\ 10 pts ROS was otherwise negative    Physical Exam Blood pressure 116/77, pulse 70, temperature 98.3 F (36.8  C), temperature source Oral, height 5' 4" (1.626 m), weight 67.586 kg (149 lb).  CONSTITUTIONAL: NAD alert NECK: Trachea is midline, and there is no jugular venous distension. Thyroid is without palpable abnormalities. LYMPH NODES:  Lymph nodes in the neck are not enlarged. RESPIRATORY:  Lungs are clear, and breath sounds are equal bilaterally. Normal respiratory effort without pathologic use of accessory muscles. CARDIOVASCULAR: Heart is regular without murmurs, gallops, or rubs. GI: The abdomen is soft, nontender, and nondistended. There were no palpable masses. There was no hepatosplenomegaly. There were normal bowel sounds. Rectal : Large grade IV Internal hemorrhoid right lateral aspect. Exquisitely tender to palpation. There are no masses. The hemorrhoid is not quite incarcerated GU: deferred MUSCULOSKELETAL:  Normal muscle strength and tone in all four extremities.    SKIN: Skin turgor is normal. There are no pathologic skin lesions.  NEUROLOGIC:  Motor and sensation is grossly normal.  Cranial nerves are grossly intact. PSYCH:  Alert and oriented to person, place and time. Affect is normal.  Data Reviewed  I have personally reviewed the patient's imaging and medical records.    Assessment/  Plan Grade 4 internal hemorrhoid not incarcerated at this time but severe tenderness and with a high risk of incarceration. Discussed with the patient in detail and she does wish surgical intervention. Unfortunately my schedule 1 allow   me to do this in a timely fashion. Discussed with her that my partener Dr. Tonita CongWoodham will be happy to do it in the  next few days. She is in agreement. Procedure discussed with the patient in detail, risk, benefits and possible complications including but not limited to: Bleeding, stenosis, prolonged and persistent anorectal pain and recurrence. She understands and wishes to proceed. In the meantime encourage her to the sitz baths anti-inflammatories and a topical and  nifedipine cream with lidocaine to help with symptom relief. If her symptoms worsen We may have to do a more urgent hemorrhoidectomy due to incarceration. Sensitive counseling provided.  Sterling Bigiego Chrysa Rampy, MD FACS  Marcelle Bebout F Alekhya Gravlin 09/12/2015, 8:52 AM

## 2015-09-12 NOTE — Telephone Encounter (Signed)
Pt advised of pre op date/time and sx date. Sx: 09/19/15 with Dr Lajuana RippleWoodham--exam under anesthesia, hemorrhoidectomy.  Pre op: 09/17/15 between 1-5--phone.   Patient made aware to call [phone], between 1-3:00pm the day before surgery, to find out what time to arrive.

## 2015-09-17 ENCOUNTER — Other Ambulatory Visit: Payer: Self-pay

## 2015-09-17 ENCOUNTER — Encounter: Payer: Self-pay | Admitting: *Deleted

## 2015-09-17 NOTE — Patient Instructions (Signed)
  Your procedure is scheduled on: 09-19-15 (FRIDAY) Report to [facility] 2ND FLOOR To find out your arrival time please call [phone] between 1PM - 3PM on 09-18-15 (THURSDAY)  Remember: Instructions that are not followed completely may result in serious medical risk, up to and including death, or upon the discretion of your surgeon and anesthesiologist your surgery may need to be rescheduled.    _X___ 1. Do not eat food or drink liquids after midnight. No gum chewing or hard candies.     _X___ 2. No Alcohol for 24 hours before or after surgery.   ____ 3. Bring all medications with you on the day of surgery if instructed.    _X___ 4. Notify your doctor if there is any change in your medical condition     (cold, fever, infections).     Do not wear jewelry, make-up, hairpins, clips or nail polish.  Do not wear lotions, powders, or perfumes. You may wear deodorant.  Do not shave 48 hours prior to surgery. Men may shave face and neck.  Do not bring valuables to the hospital.    [facility] is not responsible for any belongings or valuables.               Contacts, dentures or bridgework may not be worn into surgery.  Leave your suitcase in the car. After surgery it may be brought to your room.  For patients admitted to the hospital, discharge time is determined by your treatment team.   Patients discharged the day of surgery will not be allowed to drive home.   Please read over the following fact sheets that you were given:     ____ Take these medicines the morning of surgery with A SIP OF WATER:    1. NONE  2.   3.   4.  5.  6.  _X___ Fleet Enema (as directed)-DO FLEETS ENEMA Thursday NIGHT BEFORE BED AND Friday MORNING 1 HOUR PRIOR TO ARRIVAL TIME TO HOSPITAL  _X___ Use CHG Soap as directed  ____ Use inhalers on the day of surgery  ____ Stop metformin 2 days prior to surgery    ____ Take 1/2 of usual insulin dose the night before surgery and none on  the morning of surgery.   ____ Stop Coumadin/Plavix/aspirin-N/A  _X___ Stop Anti-inflammatories-CELEBREX OK TO CONTINUE-NO NSAIDS OR ASPIRIN PRODUCTS-PERCOCET OK TO TAKE IF NEEDED   ____ Stop supplements until after surgery.    ____ Bring C-Pap to the hospital.

## 2015-09-18 ENCOUNTER — Encounter
Admission: RE | Admit: 2015-09-18 | Discharge: 2015-09-18 | Disposition: A | Discharge status: Home / Self Care | Payer: Self-pay | Source: Ambulatory Visit | Attending: General Surgery | Admitting: General Surgery

## 2015-09-18 DIAGNOSIS — Z01810 Encounter for preprocedural cardiovascular examination: Secondary | ICD-10-CM | POA: Insufficient documentation

## 2015-09-18 DIAGNOSIS — Z01812 Encounter for preprocedural laboratory examination: Secondary | ICD-10-CM | POA: Insufficient documentation

## 2015-09-18 LAB — POTASSIUM: POTASSIUM: 3.5 mmol/L (ref 3.5–5.1)

## 2015-09-19 ENCOUNTER — Encounter: Payer: Self-pay | Admitting: *Deleted

## 2015-09-19 ENCOUNTER — Encounter
Admission: RE | Disposition: A | Discharge status: Home / Self Care | Payer: Self-pay | Source: Ambulatory Visit | Attending: General Surgery

## 2015-09-19 ENCOUNTER — Ambulatory Visit
Admission: RE | Admit: 2015-09-19 | Discharge: 2015-09-19 | Disposition: A | Discharge status: Home / Self Care | Payer: Self-pay | Source: Ambulatory Visit | Attending: General Surgery | Admitting: General Surgery

## 2015-09-19 ENCOUNTER — Ambulatory Visit: Payer: Self-pay | Admitting: Anesthesiology

## 2015-09-19 DIAGNOSIS — K644 Residual hemorrhoidal skin tags: Secondary | ICD-10-CM | POA: Insufficient documentation

## 2015-09-19 DIAGNOSIS — Z823 Family history of stroke: Secondary | ICD-10-CM | POA: Insufficient documentation

## 2015-09-19 DIAGNOSIS — Z9851 Tubal ligation status: Secondary | ICD-10-CM | POA: Insufficient documentation

## 2015-09-19 DIAGNOSIS — Z8249 Family history of ischemic heart disease and other diseases of the circulatory system: Secondary | ICD-10-CM | POA: Insufficient documentation

## 2015-09-19 DIAGNOSIS — Z9049 Acquired absence of other specified parts of digestive tract: Secondary | ICD-10-CM | POA: Insufficient documentation

## 2015-09-19 DIAGNOSIS — Z87891 Personal history of nicotine dependence: Secondary | ICD-10-CM | POA: Insufficient documentation

## 2015-09-19 DIAGNOSIS — Z9889 Other specified postprocedural states: Secondary | ICD-10-CM | POA: Insufficient documentation

## 2015-09-19 DIAGNOSIS — Z803 Family history of malignant neoplasm of breast: Secondary | ICD-10-CM | POA: Insufficient documentation

## 2015-09-19 DIAGNOSIS — I10 Essential (primary) hypertension: Secondary | ICD-10-CM | POA: Insufficient documentation

## 2015-09-19 DIAGNOSIS — K641 Second degree hemorrhoids: Secondary | ICD-10-CM | POA: Insufficient documentation

## 2015-09-19 DIAGNOSIS — K642 Third degree hemorrhoids: Secondary | ICD-10-CM | POA: Insufficient documentation

## 2015-09-19 DIAGNOSIS — K643 Fourth degree hemorrhoids: Secondary | ICD-10-CM | POA: Insufficient documentation

## 2015-09-19 DIAGNOSIS — Z79891 Long term (current) use of opiate analgesic: Secondary | ICD-10-CM | POA: Insufficient documentation

## 2015-09-19 HISTORY — DX: Unspecified osteoarthritis, unspecified site: M19.90

## 2015-09-19 HISTORY — PX: EVALUATION UNDER ANESTHESIA WITH HEMORRHOIDECTOMY: SHX5624

## 2015-09-19 SURGERY — EXAM UNDER ANESTHESIA WITH HEMORRHOIDECTOMY
Anesthesia: General | Wound class: Contaminated

## 2015-09-19 MED ORDER — LIDOCAINE-EPINEPHRINE 1 %-1:100000 IJ SOLN
INTRAMUSCULAR | Status: DC | PRN
  Administered 2015-09-19: 10 mL

## 2015-09-19 MED ORDER — OXYCODONE-ACETAMINOPHEN 5-325 MG PO TABS: 1.0000 | ORAL_TABLET | ORAL | Status: DC | PRN

## 2015-09-19 MED ORDER — SODIUM CHLORIDE 0.9 % IV SOLN
INTRAVENOUS | Status: DC | PRN
  Administered 2015-09-19: 40 mL

## 2015-09-19 MED ORDER — SODIUM CHLORIDE 0.9 % IJ SOLN
INTRAMUSCULAR | Status: AC
  Filled 2015-09-19: qty 50

## 2015-09-19 MED ORDER — MIDAZOLAM HCL 2 MG/2ML IJ SOLN
INTRAMUSCULAR | Status: DC | PRN
  Administered 2015-09-19: 2 mg via INTRAVENOUS

## 2015-09-19 MED ORDER — EPHEDRINE SULFATE 50 MG/ML IJ SOLN
INTRAMUSCULAR | Status: DC | PRN
  Administered 2015-09-19: 20 mg via INTRAVENOUS

## 2015-09-19 MED ORDER — ONDANSETRON HCL 4 MG/2ML IJ SOLN
INTRAMUSCULAR | Status: DC | PRN
  Administered 2015-09-19: 4 mg via INTRAVENOUS

## 2015-09-19 MED ORDER — PROPOFOL 10 MG/ML IV BOLUS
INTRAVENOUS | Status: DC | PRN
  Administered 2015-09-19: 200 mg via INTRAVENOUS

## 2015-09-19 MED ORDER — FAMOTIDINE 20 MG PO TABS
20.0000 mg | ORAL_TABLET | Freq: Once | ORAL | Status: AC
  Administered 2015-09-19: 20 mg via ORAL

## 2015-09-19 MED ORDER — FENTANYL CITRATE (PF) 100 MCG/2ML IJ SOLN: 25.0000 ug | INTRAMUSCULAR | Status: DC | PRN

## 2015-09-19 MED ORDER — DEXAMETHASONE SODIUM PHOSPHATE 10 MG/ML IJ SOLN
INTRAMUSCULAR | Status: DC | PRN
  Administered 2015-09-19: 5 mg via INTRAVENOUS

## 2015-09-19 MED ORDER — FENTANYL CITRATE (PF) 100 MCG/2ML IJ SOLN
INTRAMUSCULAR | Status: DC | PRN
  Administered 2015-09-19: 100 ug via INTRAVENOUS

## 2015-09-19 MED ORDER — FAMOTIDINE 20 MG PO TABS
ORAL_TABLET | ORAL | Status: AC
  Administered 2015-09-19: 20 mg via ORAL
  Filled 2015-09-19: qty 1

## 2015-09-19 MED ORDER — LIDOCAINE HCL (CARDIAC) 20 MG/ML IV SOLN
INTRAVENOUS | Status: DC | PRN
  Administered 2015-09-19: 30 mg via INTRAVENOUS

## 2015-09-19 MED ORDER — BUPIVACAINE LIPOSOME 1.3 % IJ SUSP
INTRAMUSCULAR | Status: AC
  Filled 2015-09-19: qty 20

## 2015-09-19 MED ORDER — LACTATED RINGERS IV SOLN
INTRAVENOUS | Status: DC
  Administered 2015-09-19 (×2): via INTRAVENOUS

## 2015-09-19 MED ORDER — OXYCODONE HCL 5 MG PO TABS: 5.0000 mg | ORAL_TABLET | Freq: Once | ORAL | Status: DC | PRN

## 2015-09-19 MED ORDER — FLEET ENEMA 7-19 GM/118ML RE ENEM: 1.0000 | ENEMA | Freq: Once | RECTAL | Status: DC

## 2015-09-19 MED ORDER — LIDOCAINE-EPINEPHRINE 1 %-1:100000 IJ SOLN
INTRAMUSCULAR | Status: AC
  Filled 2015-09-19: qty 2

## 2015-09-19 MED ORDER — CHLORHEXIDINE GLUCONATE 4 % EX LIQD: 1.0000 "application " | Freq: Once | CUTANEOUS | Status: DC

## 2015-09-19 MED ORDER — OXYCODONE HCL 5 MG/5ML PO SOLN: 5.0000 mg | Freq: Once | ORAL | Status: DC | PRN

## 2015-09-19 MED ORDER — KETOROLAC TROMETHAMINE 30 MG/ML IJ SOLN
INTRAMUSCULAR | Status: DC | PRN
  Administered 2015-09-19: 30 mg via INTRAVENOUS

## 2015-09-19 SURGICAL SUPPLY — 26 items
BRIEF STRETCH MATERNITY 2XLG (MISCELLANEOUS) ×3 IMPLANT
CANISTER SUCT 1200ML W/VALVE (MISCELLANEOUS) ×3 IMPLANT
CUP MEDICINE 2OZ PLAST GRAD ST (MISCELLANEOUS) ×3 IMPLANT
DRAPE LAPAROTOMY 100X77 ABD (DRAPES) ×3 IMPLANT
ELECT REM PT RETURN 9FT ADLT (ELECTROSURGICAL) ×3
ELECTRODE REM PT RTRN 9FT ADLT (ELECTROSURGICAL) ×1 IMPLANT
GLOVE BIO SURGEON STRL SZ7.5 (GLOVE) ×3 IMPLANT
GOWN STRL REUS W/ TWL LRG LVL3 (GOWN DISPOSABLE) ×2 IMPLANT
GOWN STRL REUS W/TWL LRG LVL3 (GOWN DISPOSABLE) ×4
HARMONIC SCALPEL FOCUS (MISCELLANEOUS) ×3 IMPLANT
KIT RM TURNOVER STRD PROC AR (KITS) ×3 IMPLANT
LABEL OR SOLS (LABEL) ×3 IMPLANT
NEEDLE HYPO 22GX1.5 SAFETY (NEEDLE) ×3 IMPLANT
NEEDLE HYPO 25X1 1.5 SAFETY (NEEDLE) ×3 IMPLANT
NS IRRIG 500ML POUR BTL (IV SOLUTION) ×3 IMPLANT
PACK BASIN MINOR ARMC (MISCELLANEOUS) ×3 IMPLANT
PAD ABD DERMACEA PRESS 5X9 (GAUZE/BANDAGES/DRESSINGS) ×3 IMPLANT
PAD TELFA 2X3 NADH STRL (GAUZE/BANDAGES/DRESSINGS) ×3 IMPLANT
SOL PREP PVP 2OZ (MISCELLANEOUS) ×3
SOLUTION PREP PVP 2OZ (MISCELLANEOUS) ×1 IMPLANT
SURGILUBE 2OZ TUBE FLIPTOP (MISCELLANEOUS) ×3 IMPLANT
SUT CHROMIC 3 0 SH 27 (SUTURE) ×6 IMPLANT
SWABSTK COMLB BENZOIN TINCTURE (MISCELLANEOUS) ×3 IMPLANT
SYR 20CC LL (SYRINGE) ×3 IMPLANT
SYR BULB EAR ULCER 3OZ GRN STR (SYRINGE) ×3 IMPLANT
SYRINGE 10CC LL (SYRINGE) ×3 IMPLANT

## 2015-09-19 NOTE — H&P (View-Only) (Signed)
Patient ID: Alexis Allen, female   DOB: April 06, 1961, 55 y.o.   MRN: [id number]  History of Present Illness Alexis Allen is a 55 y.o. female evaluated for severe anorectal pain. She reports that she's been having this pain for at least week is sharp is intermittent and over the last 2 days it's unbearable. She has had some intermittent hematochezia only when she wipes and after having a bowel movement. Her pain is worsening when she sits down and when she has a bowel movement. Of note Alexis Allen dated and hemorrhoidectomy couple of years ago with good results. She was evaluated in the emergency room 2 days ago and was given a topical cream and Anusol suppositories. Unfortunately she could not afford the Anusol suppositories and cream still not helping her. She has been doing some sitz baths and stool softener but she has failed this measurements and she is miserable  Past Medical History Past Medical History  Diagnosis Date  . Hypertension   . Hemorrhoids       Past Surgical History  Procedure Laterality Date  . Tubal ligation    . Cholecystectomy    . Dilation and curettage of uterus    . Tonsillectomy    . Hemorrhoid surgery      No Known Allergies  Current Outpatient Prescriptions  Medication Sig Dispense Refill  . oxyCODONE-acetaminophen (ROXICET) 5-325 MG tablet Take 1-2 tablets by mouth every 4 (four) hours as needed for severe pain. 30 tablet 0   No current facility-administered medications for this visit.    Family History Family History  Problem Relation Age of Onset  . Breast cancer Neg Hx   . Cancer Mother     ovarian  . Hypertension Father   . Stroke Father      Social History Social History  Substance Use Topics  . Smoking status: Former Smoker    Types: Cigarettes    Quit date: 06/25/2015  . Smokeless tobacco: None  . Alcohol Use: No      ROS\ 10 pts ROS was otherwise negative    Physical Exam Blood pressure 116/77, pulse 70, temperature 98.3 F (36.8  C), temperature source Oral, height  (1.626 m), weight 67.586 kg (149 lb).  CONSTITUTIONAL: NAD alert NECK: Trachea is midline, and there is no jugular venous distension. Thyroid is without palpable abnormalities. LYMPH NODES:  Lymph nodes in the neck are not enlarged. RESPIRATORY:  Lungs are clear, and breath sounds are equal bilaterally. Normal respiratory effort without pathologic use of accessory muscles. CARDIOVASCULAR: Heart is regular without murmurs, gallops, or rubs. GI: The abdomen is soft, nontender, and nondistended. There were no palpable masses. There was no hepatosplenomegaly. There were normal bowel sounds. Rectal : Large grade IV Internal hemorrhoid right lateral aspect. Exquisitely tender to palpation. There are no masses. The hemorrhoid is not quite incarcerated GU: deferred MUSCULOSKELETAL:  Normal muscle strength and tone in all four extremities.    SKIN: Skin turgor is normal. There are no pathologic skin lesions.  NEUROLOGIC:  Motor and sensation is grossly normal.  Cranial nerves are grossly intact. PSYCH:  Alert and oriented to person, place and time. Affect is normal.  Data Reviewed  I have personally reviewed the patient's imaging and medical records.    Assessment/  Plan Grade 4 internal hemorrhoid not incarcerated at this time but severe tenderness and with a high risk of incarceration. Discussed with the patient in detail and she does wish surgical intervention. Unfortunately my schedule 1 allow  me to do this in a timely fashion. Discussed with her that my partener Alexis Allen will be happy to do it in the  next few days. She is in agreement. Procedure discussed with the patient in detail, risk, benefits and possible complications including but not limited to: Bleeding, stenosis, prolonged and persistent anorectal pain and recurrence. She understands and wishes to proceed. In the meantime encourage her to the sitz baths anti-inflammatories and a topical and  nifedipine cream with lidocaine to help with symptom relief. If her symptoms worsen We may have to do a more urgent hemorrhoidectomy due to incarceration. Sensitive counseling provided.  Alexis Bigiego Briannon Boggio, MD FACS  Alexis Allen 09/12/2015, 8:52 AM

## 2015-09-19 NOTE — Brief Op Note (Signed)
09/19/2015  11:12 AM  PATIENT:  Alexis Allen  55 y.o. female  PRE-OPERATIVE DIAGNOSIS:  Hemorrhoids  POST-OPERATIVE DIAGNOSIS:  Hemorrhoids  PROCEDURE:  Procedure(s): EXAM UNDER ANESTHESIA WITH HEMORRHOIDECTOMY (N/A)  SURGEON:  Surgeon(s) and Role:    * Ricarda Frameharles Correll Denbow, MD - Primary  PHYSICIAN ASSISTANT:   ASSISTANTS: none   ANESTHESIA:   general  EBL:  Total I/O In: 900 [I.V.:900] Out: 5 [Blood:5]  BLOOD ADMINISTERED:none  DRAINS: none   LOCAL MEDICATIONS USED:  XYLOCAINE  and OTHER Exparel  SPECIMEN:  Source of Specimen:  Hemorrhoid columns 2  DISPOSITION OF SPECIMEN:  PATHOLOGY  COUNTS:  YES  TOURNIQUET:  * No tourniquets in log *  DICTATION: .Dragon Dictation  PLAN OF CARE: Discharge to home after PACU  PATIENT DISPOSITION:  PACU - hemodynamically stable.   Delay start of Pharmacological VTE agent (>24hrs) due to surgical blood loss or risk of bleeding: no

## 2015-09-19 NOTE — Transfer of Care (Signed)
Immediate Anesthesia Transfer of Care Note  Patient: Alexis Allen  Procedure(s) Performed: Procedure(s): EXAM UNDER ANESTHESIA WITH HEMORRHOIDECTOMY (N/A)  Patient Location: PACU  Anesthesia Type:General  Level of Consciousness: awake  Airway & Oxygen Therapy: Patient Spontanous Breathing and Patient connected to nasal cannula oxygen  Post-op Assessment: Report given to RN and Post -op Vital signs reviewed and stable  Post vital signs: Reviewed and stable  Last Vitals:  Filed Vitals:   09/19/15 0923  BP: 125/53  Pulse: 63  Temp: 35.6 C  Resp: 16    Last Pain:  Filed Vitals:   09/19/15 1117  PainSc: 4          Complications: No apparent anesthesia complications

## 2015-09-19 NOTE — Interval H&P Note (Signed)
History and Physical Interval Note:  09/19/2015 10:24 AM  Alexis PoseySandra J Byrd  has presented today for surgery, with the diagnosis of Hemorrhoids  The various methods of treatment have been discussed with the patient and family. After consideration of risks, benefits and other options for treatment, the patient has consented to  Procedure(s): EXAM UNDER ANESTHESIA WITH HEMORRHOIDECTOMY (N/A) as a surgical intervention .  The patient's history has been reviewed, patient examined, no change in status, stable for surgery.  I have reviewed the patient's chart and labs.  Questions were answered to the patient's satisfaction.     Ricarda Frameharles Uriah Philipson

## 2015-09-19 NOTE — Op Note (Signed)
   Pre-operative Diagnosis: Hemorrhoids  Post-operative Diagnosis: Same  Surgeon: Ricarda Frameharles Kioni Stahl   Assistants: None  Anesthesia: General endotracheal anesthesia  ASA Class: 2  Surgeon: Ricarda Frameharles Jaleal Schliep, MD FACS  Anesthesia: Gen. with endotracheal tube  Assistant: None  Procedure Details  The patient was seen again in the Holding Room. The benefits, complications, treatment options, and expected outcomes were discussed with the patient. The risks of bleeding, infection, recurrence of symptoms, failure to resolve symptoms,  sphincter injury, any of which could require further surgery were reviewed with the patient.   The patient was taken to Operating Room, identified as Alexis Allen and the procedure verified.  A Time Out was held and the above information confirmed.  Prior to the induction of general anesthesia, antibiotic prophylaxis was administered. VTE prophylaxis was in place. General endotracheal anesthesia was then administered and tolerated well. After the induction, the perineum was prepped with Betadine and draped in the sterile fashion. The patient was positioned in the high lithotomy position.  Procedure began with a digital rectal exam. 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 She also had complex internal and external hemorrhoid columns on the right anterior and posterior dissection of the rectum. After the rectal exam a field block with 1% lidocaine was performed in all directions around the rectum. The decision was made to excise the complex hemorrhoidal tissue on the right. 2 separate columns were grasped with Allis forceps the external component was excised with Bovie electrocautery and the internal component was excised with a Harmonic scalpel. The resulting mucosal defects were then reapproximated loosely with interrupted chromic suture.  After the excision of the 2 columns there was no obvious pathology to excise. There were  some residual skin tags but were unrelated to the hemorrhoids themselves. The entire area was then locally blocked with Exparel for longer local relief. The patient was returned to a supine position where a ABDs pad and mesh underwear was placed as a dressing. She is welcome from general anesthesia without any immediate, complications. All counts are correct at the end of procedure.  Findings: Anal fissure, complex internal and external hemorrhoids of the right anterior and posterior columns   Estimated Blood Loss: 10 mL         Drains: None         Specimens: Hemorrhoid columns 2          Complications: None                  Condition: Good   Ricarda Frameharles Caeson Filippi, MD, FACS

## 2015-09-19 NOTE — Discharge Instructions (Signed)
Hemorrhoids °Hemorrhoids are swollen veins around the rectum or anus. There are two types of hemorrhoids:  °· Internal hemorrhoids. These occur in the veins just inside the rectum. They may poke through to the outside and become irritated and painful. °· External hemorrhoids. These occur in the veins outside the anus and can be felt as a painful swelling or hard lump near the anus. °CAUSES °· Pregnancy.   °· Obesity.   °· Constipation or diarrhea.   °· Straining to have a bowel movement.   °· Sitting for long periods on the toilet. °· Heavy lifting or other activity that caused you to strain. °· Anal intercourse. °SYMPTOMS  °· Pain.   °· Anal itching or irritation.   °· Rectal bleeding.   °· Fecal leakage.   °· Anal swelling.   °· One or more lumps around the anus.   °DIAGNOSIS  °Your caregiver may be able to diagnose hemorrhoids by visual examination. Other examinations or tests that may be performed include:  °· Examination of the rectal area with a gloved hand (digital rectal exam).   °· Examination of anal canal using a small tube (scope).   °· A blood test if you have lost a significant amount of blood. °· A test to look inside the colon (sigmoidoscopy or colonoscopy). °TREATMENT °Most hemorrhoids can be treated at home. However, if symptoms do not seem to be getting better or if you have a lot of rectal bleeding, your caregiver may perform a procedure to help make the hemorrhoids get smaller or remove them completely. Possible treatments include:  °· Placing a rubber band at the base of the hemorrhoid to cut off the circulation (rubber band ligation).   °· Injecting a chemical to shrink the hemorrhoid (sclerotherapy).   °· Using a tool to burn the hemorrhoid (infrared light therapy).   °· Surgically removing the hemorrhoid (hemorrhoidectomy).   °· Stapling the hemorrhoid to block blood flow to the tissue (hemorrhoid stapling).   °HOME CARE INSTRUCTIONS  °· Eat foods with fiber, such as whole grains, beans,  nuts, fruits, and vegetables. Ask your doctor about taking products with added fiber in them (fiber supplements). °· Increase fluid intake. Drink enough water and fluids to keep your urine clear or pale yellow.   °· Exercise regularly.   °· Go to the bathroom when you have the urge to have a bowel movement. Do not wait.   °· Avoid straining to have bowel movements.   °· Keep the anal area dry and clean. Use wet toilet paper or moist towelettes after a bowel movement.   °· Medicated creams and suppositories may be used or applied as directed.   °· Only take over-the-counter or prescription medicines as directed by your caregiver.   °· Take warm sitz baths for 15-20 minutes, 3-4 times a day to ease pain and discomfort.   °· Place ice packs on the hemorrhoids if they are tender and swollen. Using ice packs between sitz baths may be helpful.   °· Put ice in a plastic bag.   °· Place a towel between your skin and the bag.   °· Leave the ice on for 15-20 minutes, 3-4 times a day.   °· Do not use a donut-shaped pillow or sit on the toilet for long periods. This increases blood pooling and pain.   °SEEK MEDICAL CARE IF: °· You have increasing pain and swelling that is not controlled by treatment or medicine. °· You have uncontrolled bleeding. °· You have difficulty or you are unable to have a bowel movement. °· You have pain or inflammation outside the area of the hemorrhoids. °MAKE SURE YOU: °· Understand these instructions. °·   Will watch your condition.  Will get help right away if you are not doing well or get worse.   This information is not intended to replace advice given to you by your health care provider. Make sure you discuss any questions you have with your health care provider.   Document Released: 04/09/2000 Document Revised: 03/29/2012 Document Reviewed: 02/15/2012 Elsevier Interactive Patient Education 2016 [employer] Anal Fissure, Adult An anal fissure is a small tear or crack in the skin around  the anus. Bleeding from a fissure usually stops on its own within a few minutes. However, bleeding will often occur again with each bowel movement until the crack heals. CAUSES This condition may be caused by:  Passing large, hard stool (feces).  Frequent diarrhea.  Constipation.  Inflammatory bowel disease (Crohn disease or ulcerative colitis).  Infections.  Anal sex. SYMPTOMS Symptoms of this condition include:  Bleeding from the rectum.  Small amounts of blood seen on your stool, on toilet paper, or in the toilet after a bowel movement.  Painful bowel movements.  Itching or irritation around the anus. DIAGNOSIS A health care provider may diagnose this condition by closely examining the anal area. An anal fissure can usually be seen with careful inspection. In some cases, a rectal exam may be performed, or a short tube (anoscope) may be used to examine the anal canal. TREATMENT Treatment for this condition may include:  Taking steps to avoid constipation. This may include making changes to your diet, such as increasing your intake of fiber or fluid.  Taking fiber supplements. These supplements can soften your stool to help make bowel movements easier. Your health care provider may also prescribe a stool softener if your stool is often hard.  Taking sitz baths. This may help to heal the tear.  Using medicated creams or ointments. These may be prescribed to lessen discomfort. HOME CARE INSTRUCTIONS Eating and Drinking  Avoid foods that may be constipating, such as bananas and dairy products.  Drink enough fluid to keep your urine clear or pale yellow.  Maintain a diet that is high in fruits, whole grains, and vegetables. General Instructions  Keep the anal area as clean and dry as possible.  Take sitz baths as told by your health care provider. Do not use soap in the sitz baths.  Take over-the-counter and prescription medicines only as told by your health care  provider.  Use creams or ointments only as told by your health care provider.  Keep all follow-up visits as told by your health care provider. This is important. SEEK MEDICAL CARE IF:  You have more bleeding.  You have a fever.  You have diarrhea that is mixed with blood.  You continue to have pain.  Your problem is getting worse rather than better.   This information is not intended to replace advice given to you by your health care provider. Make sure you discuss any questions you have with your health care provider.   Document Released: 04/12/2005 Document Revised: 01/01/2015 Document Reviewed: 07/08/2014 Elsevier Interactive Patient Education 2016 [employer]     AMBULATORY SURGERY  DISCHARGE INSTRUCTIONS   1) The drugs that you were given will stay in your system until tomorrow so for the next 24 hours you should not:  A) Drive an automobile B) Make any legal decisions C) Drink any alcoholic beverage   2) You may resume regular meals tomorrow.  Today it is better to start with liquids and gradually work up to solid  foods.  You may eat anything you prefer, but it is better to start with liquids, then soup and crackers, and gradually work up to solid foods.   3) Please notify your doctor immediately if you have any unusual bleeding, trouble breathing, redness and pain at the surgery site, drainage, fever, or pain not relieved by medication.    4) Additional Instructions:   Please contact your physician with any problems or Same Day Surgery at [phone], Monday through Friday 6 am to 4 pm, or [place] at [facility] number at [phone].

## 2015-09-19 NOTE — Anesthesia Preprocedure Evaluation (Signed)
Anesthesia Evaluation  Patient identified by MRN, date of birth, ID band Patient awake    Reviewed: Allergy & Precautions, H&P , NPO status , Patient's Chart, lab work & pertinent test results  History of Anesthesia Complications Negative for: history of anesthetic complications  Airway Mallampati: III  TM Distance: >3 FB Neck ROM: full    Dental  (+) Poor Dentition, Chipped, Missing, Caps   Pulmonary neg shortness of breath, former smoker,    Pulmonary exam normal breath sounds clear to auscultation       Cardiovascular Exercise Tolerance: Good hypertension, (-) angina(-) Past MI and (-) DOE Normal cardiovascular exam Rhythm:regular Rate:Normal     Neuro/Psych negative neurological ROS  negative psych ROS   GI/Hepatic negative GI ROS, Neg liver ROS, neg GERD  ,  Endo/Other  negative endocrine ROS  Renal/GU negative Renal ROS  negative genitourinary   Musculoskeletal  (+) Arthritis ,   Abdominal   Peds  Hematology negative hematology ROS (+)   Anesthesia Other Findings Past Medical History:   Hypertension                                                 Hemorrhoids                                                  Arthritis                                                      Comment:LOWER BACK AND NECK  Past Surgical History:   TUBAL LIGATION                                                CHOLECYSTECTOMY                                               DILATION AND CURETTAGE OF UTERUS                              TONSILLECTOMY                                                 HEMORRHOID SURGERY                                           BMI    Body Mass Index   25.56 kg/m 2    Signs and symptoms suggestive of sleep apnea    Reproductive/Obstetrics negative OB ROS  Anesthesia Physical Anesthesia Plan  ASA: III  Anesthesia Plan: General LMA   Post-op  Pain Management:    Induction:   Airway Management Planned:   Additional Equipment:   Intra-op Plan:   Post-operative Plan:   Informed Consent: I have reviewed the patients History and Physical, chart, labs and discussed the procedure including the risks, benefits and alternatives for the proposed anesthesia with the patient or authorized representative who has indicated his/her understanding and acceptance.   Dental Advisory Given  Plan Discussed with: Anesthesiologist, CRNA and Surgeon  Anesthesia Plan Comments:         Anesthesia Quick Evaluation

## 2015-09-19 NOTE — Anesthesia Postprocedure Evaluation (Signed)
Anesthesia Post Note  Patient: Alexis PoseySandra J Allen  Procedure(s) Performed: Procedure(s) (LRB): EXAM UNDER ANESTHESIA WITH HEMORRHOIDECTOMY (N/A)  Patient location during evaluation: PACU Anesthesia Type: General Level of consciousness: awake and alert Pain management: pain level controlled Vital Signs Assessment: post-procedure vital signs reviewed and stable Respiratory status: spontaneous breathing, nonlabored ventilation, respiratory function stable and patient connected to nasal cannula oxygen Cardiovascular status: blood pressure returned to baseline and stable Postop Assessment: no signs of nausea or vomiting Anesthetic complications: no    Last Vitals:  Filed Vitals:   09/19/15 1145 09/19/15 1200  BP: 121/76 129/66  Pulse: 86 83  Temp:  36.5 C  Resp: 16 16    Last Pain:  Filed Vitals:   09/19/15 1203  PainSc: 1                  Cleda MccreedyJoseph K Piscitello

## 2015-09-21 ENCOUNTER — Encounter: Payer: Self-pay | Admitting: General Surgery

## 2015-09-24 LAB — SURGICAL PATHOLOGY

## 2015-09-25 ENCOUNTER — Other Ambulatory Visit: Payer: Self-pay | Admitting: General Surgery

## 2015-09-25 MED ORDER — OXYCODONE-ACETAMINOPHEN 5-325 MG PO TABS: 1.0000 | ORAL_TABLET | Freq: Four times a day (QID) | ORAL | Status: DC | PRN

## 2015-09-25 NOTE — Telephone Encounter (Signed)
Patient had HEMORRHOIDECTOMY on 5/26 with Dr Tonita CongWoodham. She is out of pain medication and is still experiencing a lot of pain. Requesting a refill. Please call and advise.

## 2015-09-25 NOTE — Telephone Encounter (Signed)
Called patient back and she stated that she  continues to have pain on her rectum. She stated that she doesn't have any more pain medications. I recommended patient to take Miralax and stool softeners to help her with her bowels since she stated that she felt horrible when she had a bowel movement. Patient stated that she was doing that already. I also recommended for her to do sitz baths at least four times a day to help with the swelling. In addition, I told her to send her husband to go to the [place] office and pick up her prescription. She agreed. I reminder her to come to her appointment on 10/06/2015 at 3:00 PM with Dr. Tonita CongWoodham, but if she needed before to please give us a call.

## 2015-10-06 ENCOUNTER — Ambulatory Visit (INDEPENDENT_AMBULATORY_CARE_PROVIDER_SITE_OTHER): Payer: Self-pay | Admitting: General Surgery

## 2015-10-06 ENCOUNTER — Encounter: Payer: Self-pay | Admitting: General Surgery

## 2015-10-06 VITALS — BP 127/78 | HR 70 | Temp 98.7°F | Ht 64.0 in | Wt 152.0 lb

## 2015-10-06 DIAGNOSIS — Z4889 Encounter for other specified surgical aftercare: Secondary | ICD-10-CM

## 2015-10-06 MED ORDER — OXYCODONE-ACETAMINOPHEN 5-325 MG PO TABS: 1.0000 | ORAL_TABLET | Freq: Four times a day (QID) | ORAL | Status: DC | PRN

## 2015-10-06 NOTE — Progress Notes (Signed)
Outpatient Surgical Follow Up  10/06/2015  Alexis Allen is an 55 y.o. female.   Chief Complaint  Patient presents with  . Routine Post Op    Hemorrhoidectomy 09/19/2015 Dr. Tonita CongWoodham    HPI: 55 year old female returns to clinic for follow-up 2 weeks status post 2 column hemorrhoidectomy. Patient reports that when the local anesthetic wore off, approximately 36 hours postop, she had a large amount of pain to the surgical site. This was exactly as she had been counseled preoperatively would occur. She has been able to find the balance of pain medication and stool softeners to maintain daily soft bowel movements and has had a gradual improvement in her pain. She denies any fevers, chills, nausea, vomiting, chest pain, shortness breath, diarrhea, constipation. She continues to need pain medications, however her need is gradually decreasing.  Past Medical History  Diagnosis Date  . Hypertension   . Hemorrhoids   . Arthritis     LOWER BACK AND NECK    Past Surgical History  Procedure Laterality Date  . Tubal ligation    . Cholecystectomy    . Dilation and curettage of uterus    . Tonsillectomy    . Hemorrhoid surgery    . Evaluation under anesthesia with hemorrhoidectomy N/A 09/19/2015    Procedure: EXAM UNDER ANESTHESIA WITH HEMORRHOIDECTOMY;  Surgeon: Ricarda Frameharles Diago Haik, MD;  Location: [facility] ORS;  Service: General;  Laterality: N/A;    Family History  Problem Relation Age of Onset  . Breast cancer Neg Hx   . Cancer Mother     ovarian  . Hypertension Father   . Stroke Father     Social History:  reports that she quit smoking about 3 months ago. Her smoking use included Cigarettes. She has a 6.25 pack-year smoking history. She has never used smokeless tobacco. She reports that she drinks alcohol. She reports that she does not use illicit drugs.  Allergies: No Known Allergies  Medications reviewed.    ROS A multipoint review of systems was completed. All pertinent positives and  negatives were documented within the history of present illness the remainder negative.   BP 127/78 mmHg  Pulse 70  Temp(Src) 98.7 F (37.1 C) (Oral)  Ht 5\' 4"  (1.626 m)  Wt 68.947 kg (152 lb)  BMI 26.08 kg/m2  Physical Exam Gen.: No acute distress Chest: Clear to all fixation Heart: Regular rhythm Abdomen: Soft and nontender Rectum: Rectal exam performed which showed well approximated 2 column hemorrhoidectomy suture lines. No evidence of spreading erythema or purulence. Tender to palpation but no evidence of visible fissure on exam today.    No results found for this or any previous visit (from the past 48 hour(s)). No results found.  Assessment/Plan:  1. Aftercare following surgery 55 year old female 2 weeks status post hemorrhoidectomy x 2. Proceeding as anticipated. We will provide with an additional prescription for pain medications today. Discussed with the patient and the importance of being off of pain medication during the day prior to return to work. Discussed anticipated recovery time frame with the patient who voiced understanding. She'll follow-up in clinic in 3 weeks for additional wound check.     Ricarda Frameharles Graysen Depaula, MD FACS General Surgeon  10/06/2015,4:15 PM

## 2015-10-06 NOTE — Patient Instructions (Signed)
We have refilled your prescription for your pain medication today. Please take this to your pharmacy.  Continue to do your sitz baths, stool softeners, and Miralax to aid in pain and keeping this area clean.  We will have you return to clinic in 3 weeks. See appointment below.  You may return to work when you are no longer requiring any narcotic pain medication throughout the day. When you are to this point, call us and we will get you a return to work note with restrictions.

## 2015-10-21 ENCOUNTER — Emergency Department
Admission: EM | Admit: 2015-10-21 | Discharge: 2015-10-21 | Disposition: A | Discharge status: Home / Self Care | Payer: Worker's Compensation | Attending: Emergency Medicine | Admitting: Emergency Medicine

## 2015-10-21 DIAGNOSIS — M1990 Unspecified osteoarthritis, unspecified site: Secondary | ICD-10-CM | POA: Insufficient documentation

## 2015-10-21 DIAGNOSIS — Y9389 Activity, other specified: Secondary | ICD-10-CM | POA: Diagnosis not present

## 2015-10-21 DIAGNOSIS — S61012A Laceration without foreign body of left thumb without damage to nail, initial encounter: Secondary | ICD-10-CM | POA: Diagnosis present

## 2015-10-21 DIAGNOSIS — Z79899 Other long term (current) drug therapy: Secondary | ICD-10-CM | POA: Insufficient documentation

## 2015-10-21 DIAGNOSIS — W268XXA Contact with other sharp object(s), not elsewhere classified, initial encounter: Secondary | ICD-10-CM | POA: Diagnosis not present

## 2015-10-21 DIAGNOSIS — S61011A Laceration without foreign body of right thumb without damage to nail, initial encounter: Secondary | ICD-10-CM

## 2015-10-21 DIAGNOSIS — Y990 Civilian activity done for income or pay: Secondary | ICD-10-CM | POA: Diagnosis not present

## 2015-10-21 DIAGNOSIS — I10 Essential (primary) hypertension: Secondary | ICD-10-CM | POA: Insufficient documentation

## 2015-10-21 DIAGNOSIS — Y929 Unspecified place or not applicable: Secondary | ICD-10-CM | POA: Diagnosis not present

## 2015-10-21 DIAGNOSIS — Z8719 Personal history of other diseases of the digestive system: Secondary | ICD-10-CM | POA: Insufficient documentation

## 2015-10-21 DIAGNOSIS — Z87891 Personal history of nicotine dependence: Secondary | ICD-10-CM | POA: Diagnosis not present

## 2015-10-21 MED ORDER — TETANUS-DIPHTH-ACELL PERTUSSIS 5-2.5-18.5 LF-MCG/0.5 IM SUSP
0.5000 mL | Freq: Once | INTRAMUSCULAR | Status: AC
  Administered 2015-10-21: 0.5 mL via INTRAMUSCULAR
  Filled 2015-10-21: qty 0.5

## 2015-10-21 MED ORDER — LIDOCAINE HCL (PF) 1 % IJ SOLN
INTRAMUSCULAR | Status: AC
  Administered 2015-10-21: 10:00:00
  Filled 2015-10-21: qty 5

## 2015-10-21 NOTE — Discharge Instructions (Signed)
Laceration Care, Adult °A laceration is a cut that goes through all of the layers of the skin and into the tissue that is right under the skin. Some lacerations heal on their own. Others need to be closed with stitches (sutures), staples, skin adhesive strips, or skin glue. Proper laceration care minimizes the risk of infection and helps the laceration to heal better. °HOW TO CARE FOR YOUR LACERATION °If sutures or staples were used: °· Keep the wound clean and dry. °· If you were given a bandage (dressing), you should change it at least one time per day or as told by your health care provider. You should also change it if it becomes wet or dirty. °· Keep the wound completely dry for the first 24 hours or as told by your health care provider. After that time, you may shower or bathe. However, make sure that the wound is not soaked in water until after the sutures or staples have been removed. °· Clean the wound one time each day or as told by your health care provider: °· Wash the wound with soap and water. °· Rinse the wound with water to remove all soap. °· Pat the wound dry with a clean towel. Do not rub the wound. °· After cleaning the wound, apply a thin layer of antibiotic ointment as told by your health care provider. This will help to prevent infection and keep the dressing from sticking to the wound. °· Have the sutures or staples removed as told by your health care provider. °If skin adhesive strips were used: °· Keep the wound clean and dry. °· If you were given a bandage (dressing), you should change it at least one time per day or as told by your health care provider. You should also change it if it becomes dirty or wet. °· Do not get the skin adhesive strips wet. You may shower or bathe, but be careful to keep the wound dry. °· If the wound gets wet, pat it dry with a clean towel. Do not rub the wound. °· Skin adhesive strips fall off on their own. You may trim the strips as the wound heals. Do not  remove skin adhesive strips that are still stuck to the wound. They will fall off in time. °If skin glue was used: °· Try to keep the wound dry, but you may briefly wet it in the shower or bath. Do not soak the wound in water, such as by swimming. °· After you have showered or bathed, gently pat the wound dry with a clean towel. Do not rub the wound. °· Do not do any activities that will make you sweat heavily until the skin glue has fallen off on its own. °· Do not apply liquid, cream, or ointment medicine to the wound while the skin glue is in place. Using those may loosen the film before the wound has healed. °· If you were given a bandage (dressing), you should change it at least one time per day or as told by your health care provider. You should also change it if it becomes dirty or wet. °· If a dressing is placed over the wound, be careful not to apply tape directly over the skin glue. Doing that may cause the glue to be pulled off before the wound has healed. °· Do not pick at the glue. The skin glue usually remains in place for 5-10 days, then it falls off of the skin. °General Instructions °· Take over-the-counter and prescription   medicines only as told by your health care provider. °· If you were prescribed an antibiotic medicine or ointment, take or apply it as told by your doctor. Do not stop using it even if your condition improves. °· To help prevent scarring, make sure to cover your wound with sunscreen whenever you are outside after stitches are removed, after adhesive strips are removed, or when glue remains in place and the wound is healed. Make sure to wear a sunscreen of at least 30 SPF. °· Do not scratch or pick at the wound. °· Keep all follow-up visits as told by your health care provider. This is important. °· Check your wound every day for signs of infection. Watch for: °· Redness, swelling, or pain. °· Fluid, blood, or pus. °· Raise (elevate) the injured area above the level of your heart  while you are sitting or lying down, if possible. °SEEK MEDICAL CARE IF: °· You received a tetanus shot and you have swelling, severe pain, redness, or bleeding at the injection site. °· You have a fever. °· A wound that was closed breaks open. °· You notice a bad smell coming from your wound or your dressing. °· You notice something coming out of the wound, such as wood or glass. °· Your pain is not controlled with medicine. °· You have increased redness, swelling, or pain at the site of your wound. °· You have fluid, blood, or pus coming from your wound. °· You notice a change in the color of your skin near your wound. °· You need to change the dressing frequently due to fluid, blood, or pus draining from the wound. °· You develop a new rash. °· You develop numbness around the wound. °SEEK IMMEDIATE MEDICAL CARE IF: °· You develop severe swelling around the wound. °· Your pain suddenly increases and is severe. °· You develop painful lumps near the wound or on skin that is anywhere on your body. °· You have a red streak going away from your wound. °· The wound is on your hand or foot and you cannot properly move a finger or toe. °· The wound is on your hand or foot and you notice that your fingers or toes look pale or bluish. °  °This information is not intended to replace advice given to you by your health care provider. Make sure you discuss any questions you have with your health care provider. °  °Document Released: 04/12/2005 Document Revised: 08/27/2014 Document Reviewed: 04/08/2014 °Elsevier Interactive Patient Education ©2016 [employer] ° °Stitches, Staples, or Adhesive Wound Closure °Health care providers use stitches (sutures), staples, and certain glue (skin adhesives) to hold skin together while it heals (wound closure). You may need this treatment after you have surgery or if you cut your skin accidentally. These methods help your skin to heal more quickly and make it less likely that you will have  a scar. A wound may take several months to heal completely. °The type of wound you have determines when your wound gets closed. In most cases, the wound is closed as soon as possible (primary skin closure). Sometimes, closure is delayed so the wound can be cleaned and allowed to heal naturally. This reduces the chance of infection. Delayed closure may be needed if your wound: °· Is caused by a bite. °· Happened more than 6 hours ago. °· Involves loss of skin or the tissues under the skin. °· Has dirt or debris in it that cannot be removed. °· Is infected. °WHAT   ARE THE DIFFERENT KINDS OF WOUND CLOSURES? °There are many options for wound closure. The one that your health care provider uses depends on how deep and how large your wound is. °Adhesive Glue °To use this type of glue to close a wound, your health care provider holds the edges of the wound together and paints the glue on the surface of your skin. You may need more than one layer of glue. Then the wound may be covered with a light bandage (dressing). °This type of skin closure may be used for small wounds that are not deep (superficial). Using glue for wound closure is less painful than other methods. It does not require a medicine that numbs the area (local anesthetic). This method also leaves nothing to be removed. Adhesive glue is often used for children and on facial wounds. °Adhesive glue cannot be used for wounds that are deep, uneven, or bleeding. It is not used inside of a wound.  °Adhesive Strips °These strips are made of sticky (adhesive), porous paper. They are applied across your skin edges like a regular adhesive bandage. You leave them on until they fall off. °Adhesive strips may be used to close very superficial wounds. They may also be used along with sutures to improve the closure of your skin edges.  °Sutures °Sutures are the oldest method of wound closure. Sutures can be made from natural substances, such as silk, or from synthetic  materials, such as nylon and steel. They can be made from a material that your body can break down as your wound heals (absorbable), or they can be made from a material that needs to be removed from your skin (nonabsorbable). They come in many different strengths and sizes. °Your health care provider attaches the sutures to a steel needle on one end. Sutures can be passed through your skin, or through the tissues beneath your skin. Then they are tied and cut. Your skin edges may be closed in one continuous stitch or in separate stitches. °Sutures are strong and can be used for all kinds of wounds. Absorbable sutures may be used to close tissues under the skin. The disadvantage of sutures is that they may cause skin reactions that lead to infection. Nonabsorbable sutures need to be removed. °Staples °When surgical staples are used to close a wound, the edges of your skin on both sides of the wound are brought close together. A staple is placed across the wound, and an instrument secures the edges together. Staples are often used to close surgical cuts (incisions). °Staples are faster to use than sutures, and they cause less skin reaction. Staples need to be removed using a tool that bends the staples away from your skin. °HOW DO I CARE FOR MY WOUND CLOSURE? °· Take medicines only as directed by your health care provider. °· If you were prescribed an antibiotic medicine for your wound, finish it all even if you start to feel better. °· Use ointments or creams only as directed by your health care provider. °· Wash your hands with soap and water before and after touching your wound. °· Do not soak your wound in water. Do not take baths, swim, or use a hot tub until your health care provider approves. °· Ask your health care provider when you can start showering. Cover your wound if directed by your health care provider. °· Do not take out your own sutures or staples. °· Do not pick at your wound. Picking can cause an  infection. °·   Keep all follow-up visits as directed by your health care provider. This is important. °HOW LONG WILL I HAVE MY WOUND CLOSURE? °· Leave adhesive glue on your skin until the glue peels away. °· Leave adhesive strips on your skin until the strips fall off. °· Absorbable sutures will dissolve within several days. °· Nonabsorbable sutures and staples must be removed. The location of the wound will determine how long they stay in. This can range from several days to a couple of weeks. °WHEN SHOULD I SEEK HELP FOR MY WOUND CLOSURE? °Contact your health care provider if: °· You have a fever. °· You have chills. °· You have drainage, redness, swelling, or pain at your wound. °· There is a bad smell coming from your wound. °· The skin edges of your wound start to separate after your sutures have been removed. °· Your wound becomes thick, raised, and darker in color after your sutures come out (scarring). °  °This information is not intended to replace advice given to you by your health care provider. Make sure you discuss any questions you have with your health care provider. °  °Document Released: 01/05/2001 Document Revised: 05/03/2014 Document Reviewed: 09/19/2013 °Elsevier Interactive Patient Education ©2016 [employer] Inc. ° °

## 2015-10-21 NOTE — ED Notes (Signed)
Pt states she cut her right thumb with a box cutter while at work today.. Bandage in place on arrival.. Pt works at [employer] Spoke with [employer]videos[employer]visor Butch Smitty at [phone], states the pt does Not require any UDS for worker comp.

## 2015-10-21 NOTE — ED Notes (Signed)
See triage note  States she cut her thumb while using a box cutter at work this am

## 2015-10-21 NOTE — ED Provider Notes (Signed)
[facility] Emergency Department Provider Note   ____________________________________________  Time seen: Approximately 11:11 AM  I have reviewed the triage vital signs and the nursing notes.   HISTORY  Chief Complaint Laceration    HPI Alexis Allen is a 55 y.o. female arrives to the emergency department with complaint of laceration to the left thumb. Patient states she was opening a box with a box cutter at work today and accidentally sliced her thumb. Current pain is 2/10 and described as burning. She has not attempted pharmacologic or non-pharmacologic management of pain. Denies numbness, paraesthesias, and loss of ROM.    Past Medical History  Diagnosis Date  . Hypertension   . Hemorrhoids   . Arthritis     LOWER BACK AND NECK    Patient Active Problem List   Diagnosis Date Noted  . Fourth degree hemorrhoids   . Prolapsed internal hemorrhoids, grade 4 09/12/2015    Past Surgical History  Procedure Laterality Date  . Tubal ligation    . Cholecystectomy    . Dilation and curettage of uterus    . Tonsillectomy    . Hemorrhoid surgery    . Evaluation under anesthesia with hemorrhoidectomy N/A 09/19/2015    Procedure: EXAM UNDER ANESTHESIA WITH HEMORRHOIDECTOMY;  Surgeon: Ricarda Frameharles Woodham, MD;  Location: [facility] ORS;  Service: General;  Laterality: N/A;    Current Outpatient Rx  Name  Route  Sig  Dispense  Refill  . celecoxib (CELEBREX) 50 MG capsule   Oral   Take 50 mg by mouth at bedtime.         Tery Sanfilippo. Docusate Calcium (STOOL SOFTENER PO)   Oral   Take 2 capsules by mouth 2 (two) times daily.          . hydrochlorothiazide (HYDRODIURIL) 25 MG tablet   Oral   Take 25 mg by mouth every morning.         Marland Kitchen. losartan (COZAAR) 50 MG tablet   Oral   Take 50 mg by mouth every evening.         Marland Kitchen. oxyCODONE-acetaminophen (ROXICET) 5-325 MG tablet   Oral   Take 1 tablet by mouth every 6 (six) hours as needed.   20 tablet   0   .  polyethylene glycol (MIRALAX / GLYCOLAX) packet   Oral   Take 17 g by mouth 2 (two) times daily.            Allergies Review of patient's allergies indicates no known allergies.  Family History  Problem Relation Age of Onset  . Breast cancer Neg Hx   . Cancer Mother     ovarian  . Hypertension Father   . Stroke Father     Social History Social History  Substance Use Topics  . Smoking status: Former Smoker -- 0.25 packs/day for 25 years    Types: Cigarettes    Quit date: 06/25/2015  . Smokeless tobacco: Never Used  . Alcohol Use: Yes     Comment: RARE    Review of Systems Skin: Negative for rash, Positive for laceration to thumb    ____________________________________________   PHYSICAL EXAM:  VITAL SIGNS: ED Triage Vitals  Enc Vitals Group     BP 10/21/15 0930 144/98 mmHg     Pulse Rate 10/21/15 0930 76     Resp 10/21/15 0930 16     Temp 10/21/15 0930 98.2 F (36.8 C)     Temp Source 10/21/15 0930 Oral  SpO2 10/21/15 0930 98 %     Weight 10/21/15 0930 150 lb (68.04 kg)     Height 10/21/15 0930 5\' 4"  (1.626 m)     Head Cir --      Peak Flow --      Pain Score 10/21/15 0930 2     Pain Loc --      Pain Edu? --      Excl. in GC? --     Constitutional: Alert and oriented. Well appearing and in no acute distress. Head: Atraumatic normocephalic Cardiovascular: Normal rate, regular rhythm.  Respiratory: Normal respiratory effort.  No retractions.  Neurologic:  Normal speech and language. No gross focal neurologic deficits are appreciated. No gait instability. Skin:  Skin is warm, dry and intact. No rash noted. 2.0cm laceration to the L thumb medial to the DIP, without foreign body, erythema or edema. No obvious signs of infection. Psychiatric: Mood and affect are normal. Speech and behavior are normal.  ____________________________________________   LABS (all labs ordered are listed, but only abnormal results are displayed)  Labs Reviewed - No data  to display ____________________________________________  EKG  None ____________________________________________  RADIOLOGY  none ____________________________________________   PROCEDURES  Procedure(s) performed: LACERATION REPAIR Performed by: Evangeline DakinBEERS, CHARLES M Authorized by: Evangeline DakinBEERS, CHARLES M Consent: Verbal consent obtained. Risks and benefits: risks, benefits and alternatives were discussed Consent given by: patient Patient identity confirmed: provided demographic data Prepped and Draped in normal sterile fashion Wound explored  Laceration Location: L thumb  Laceration Length: 2.0 cm  No Foreign Bodies seen or palpated  Anesthesia: local infiltration  Local anesthetic: lidocaine 1 % without epinephrine  Anesthetic total: 3 ml  Irrigation method: syringe Amount of cleaning: standard  Skin closure: 4-0 Nylon  Number of sutures: 5  Technique: Simple interrupted  Patient tolerance: Patient tolerated the procedure well with no immediate complications. None  Critical Care performed: No  ____________________________________________   INITIAL IMPRESSION / ASSESSMENT AND PLAN / ED COURSE  Pertinent labs & imaging results that were available during my care of the patient were reviewed by me and considered in my medical decision making (see chart for details).  Laceration of L thumb, initial encounter: Patient arrived with slice wound to L thumb after cutting with box cutter. After assessment laceration was closed using sutures as listed in procedure note above. No complications throughout procedure and patient tolerated well. Stated that she did not need pain medications and declined for prescription. Advised to follow-up with PCP or urgent care. ED follow-up if unable to see other providers. Wound care discussed with patient and Splint applied to prevent excessive bending of thumb. Advised patient wear splint daily when working and as-needed for comfort. OTC Tylenol  or Ibuprofen prn pain. ____________________________________________   FINAL CLINICAL IMPRESSION(S) / ED DIAGNOSES  Final diagnoses:  Laceration of thumb without complication, right, initial encounter      NEW MEDICATIONS STARTED DURING THIS VISIT:  New Prescriptions   No medications on file     Note:  This document was prepared using Dragon voice recognition software and may include unintentional dictation errors.   Evangeline DakinCharles M Beers, PA-C 10/21/15 1131  Emily FilbertJonathan E Williams, MD 10/21/15 [phone]

## 2015-10-31 ENCOUNTER — Encounter: Payer: Self-pay | Admitting: Emergency Medicine

## 2015-10-31 ENCOUNTER — Emergency Department
Admission: EM | Admit: 2015-10-31 | Discharge: 2015-10-31 | Disposition: A | Discharge status: Home / Self Care | Payer: Worker's Compensation | Attending: Emergency Medicine | Admitting: Emergency Medicine

## 2015-10-31 ENCOUNTER — Ambulatory Visit (INDEPENDENT_AMBULATORY_CARE_PROVIDER_SITE_OTHER): Payer: Self-pay | Admitting: General Surgery

## 2015-10-31 ENCOUNTER — Encounter: Payer: Self-pay | Admitting: General Surgery

## 2015-10-31 VITALS — BP 98/65 | HR 82 | Temp 98.5°F | Ht 64.0 in | Wt 152.8 lb

## 2015-10-31 DIAGNOSIS — I10 Essential (primary) hypertension: Secondary | ICD-10-CM | POA: Insufficient documentation

## 2015-10-31 DIAGNOSIS — Z79899 Other long term (current) drug therapy: Secondary | ICD-10-CM | POA: Diagnosis not present

## 2015-10-31 DIAGNOSIS — Z4802 Encounter for removal of sutures: Secondary | ICD-10-CM | POA: Diagnosis not present

## 2015-10-31 DIAGNOSIS — M1990 Unspecified osteoarthritis, unspecified site: Secondary | ICD-10-CM | POA: Insufficient documentation

## 2015-10-31 DIAGNOSIS — Z87891 Personal history of nicotine dependence: Secondary | ICD-10-CM | POA: Insufficient documentation

## 2015-10-31 DIAGNOSIS — Z4889 Encounter for other specified surgical aftercare: Secondary | ICD-10-CM

## 2015-10-31 NOTE — Progress Notes (Signed)
Outpatient Surgical Follow Up  10/31/2015  Alexis Allen is an 55 y.o. female.   Chief Complaint  Patient presents with  . Routine Post Op    Hemorrhoidectomyx2 (09/16/15-Dr.Silverio Hagan)    HPI: 55 year old female returns to clinic for second follow-up status post hemorrhoidectomy. Patient reports that all of her complaints have resolved since her last visit. She denies any pain currently or with bowel movements. She does have 1 small area of discomfort on palpation but states this is markedly improved. She denies any fevers, chills, nausea, vomiting, diarrhea, cough patient, chest pain, short of breath. She's been very happy with her surgical drainage.  Past Medical History  Diagnosis Date  . Hypertension   . Hemorrhoids   . Arthritis     LOWER BACK AND NECK    Past Surgical History  Procedure Laterality Date  . Tubal ligation    . Cholecystectomy    . Dilation and curettage of uterus    . Tonsillectomy    . Hemorrhoid surgery    . Evaluation under anesthesia with hemorrhoidectomy N/A 09/19/2015    Procedure: EXAM UNDER ANESTHESIA WITH HEMORRHOIDECTOMY;  Surgeon: Ricarda Frameharles Cathe Bilger, MD;  Location: [facility] ORS;  Service: General;  Laterality: N/A;    Family History  Problem Relation Age of Onset  . Breast cancer Neg Hx   . Cancer Mother     ovarian  . Hypertension Father   . Stroke Father     Social History:  reports that she quit smoking about 4 months ago. Her smoking use included Cigarettes. She has a 6.25 pack-year smoking history. She has never used smokeless tobacco. She reports that she drinks alcohol. She reports that she does not use illicit drugs.  Allergies: No Known Allergies  Medications reviewed.    ROS A multipoint review of systems was completed. All pertinent positives and negatives are documented within the history of present illness and remainder are negative.   BP 98/65 mmHg  Pulse 82  Temp(Src) 98.5 F (36.9 C) (Oral)  Ht 5\' 4"  (1.626 m)  Wt 69.31 kg  (152 lb 12.8 oz)  BMI 26.22 kg/m2  Physical Exam Gen.: No acute distress Chest: Clear to auscultation Heart: Regular rate and rhythm Abdomen: Soft and nontender Rectum: Rectal exam performed which showed no evidence of residual internal hemorrhoidal disease. Small area of healing suture line to the patient's right side. No evidence of inflammation or drainage. Some external skin tags without irritation.    No results found for this or any previous visit (from the past 48 hour(s)). No results found.  Assessment/Plan:  1. Aftercare following surgery 55 year old female status post hemorrhoidectomy. Pathology reviewed with the patient. Discussed the causes of hemorrhoidal disease in detail with the patient and she is now had 2 bouts of this require surgery. All questions answered to the patient's satisfaction and she'll follow-up in clinic on an as-needed basis.     Ricarda Frameharles Leronda Lewers, MD FACS General Surgeon  10/31/2015,9:54 AM

## 2015-10-31 NOTE — Patient Instructions (Signed)
Continue your Colace and Miralax as needed.  Please call with any questions or concerns.

## 2015-10-31 NOTE — ED Provider Notes (Signed)
[facility] Emergency Department Provider Note  ____________________________________________  Time seen: Approximately 7:14 AM  I have reviewed the triage vital signs and the nursing notes.   HISTORY  Chief Complaint Suture / Staple Removal   HPI Alexis Allen is a 55 y.o. female is here for suture removal of healed laceration of her right thumb.Patient denies any difficulty, swelling, redness or drainage from the area. She denies any pain.   Past Medical History  Diagnosis Date  . Hypertension   . Hemorrhoids   . Arthritis     LOWER BACK AND NECK    Patient Active Problem List   Diagnosis Date Noted  . Fourth degree hemorrhoids   . Prolapsed internal hemorrhoids, grade 4 09/12/2015    Past Surgical History  Procedure Laterality Date  . Tubal ligation    . Cholecystectomy    . Dilation and curettage of uterus    . Tonsillectomy    . Hemorrhoid surgery    . Evaluation under anesthesia with hemorrhoidectomy N/A 09/19/2015    Procedure: EXAM UNDER ANESTHESIA WITH HEMORRHOIDECTOMY;  Surgeon: Ricarda Frameharles Woodham, MD;  Location: [facility] ORS;  Service: General;  Laterality: N/A;    Current Outpatient Rx  Name  Route  Sig  Dispense  Refill  . celecoxib (CELEBREX) 50 MG capsule   Oral   Take 50 mg by mouth at bedtime.         Tery Sanfilippo. Docusate Calcium (STOOL SOFTENER PO)   Oral   Take 2 capsules by mouth 2 (two) times daily.          . hydrochlorothiazide (HYDRODIURIL) 25 MG tablet   Oral   Take 25 mg by mouth every morning.         Marland Kitchen. losartan (COZAAR) 50 MG tablet   Oral   Take 50 mg by mouth every evening.         . polyethylene glycol (MIRALAX / GLYCOLAX) packet   Oral   Take 17 g by mouth 2 (two) times daily.            Allergies Review of patient's allergies indicates no known allergies.  Family History  Problem Relation Age of Onset  . Breast cancer Neg Hx   . Cancer Mother     ovarian  . Hypertension Father   . Stroke  Father     Social History Social History  Substance Use Topics  . Smoking status: Former Smoker -- 0.25 packs/day for 25 years    Types: Cigarettes    Quit date: 06/25/2015  . Smokeless tobacco: Never Used  . Alcohol Use: Yes     Comment: RARE    Review of Systems Constitutional: No fever/chills Cardiovascular: Denies chest pain. Respiratory: Denies shortness of breath. Musculoskeletal: Negative for back pain. Skin: Negative for rash.  Positive for healed laceration.   10-point ROS otherwise negative.  ____________________________________________   PHYSICAL EXAM:  VITAL SIGNS: ED Triage Vitals  Enc Vitals Group     BP 10/31/15 0655 128/92 mmHg     Pulse Rate 10/31/15 0655 76     Resp 10/31/15 0655 18     Temp 10/31/15 0655 98.3 F (36.8 C)     Temp Source 10/31/15 0655 Oral     SpO2 10/31/15 0655 97 %     Weight 10/31/15 0655 150 lb (68.04 kg)     Height 10/31/15 0655 5\' 4"  (1.626 m)     Head Cir --      Peak Flow --  Pain Score 10/31/15 0656 0     Pain Loc --      Pain Edu? --      Excl. in GC? --     Constitutional: Alert and oriented. Well appearing and in no acute distress. Eyes: Conjunctivae are normal. PERRL. EOMI. Head: Atraumatic. Nose: No congestion/rhinnorhea. Neck: No stridor.   Respiratory: Normal respiratory effort.  No retractions. Lungs CTAB. Musculoskeletal: Thumb range of motion is within normal limits. Neurologic:  Normal speech and language. No gross focal neurologic deficits are appreciated. No gait instability. Skin:  Skin is warm, dry. Laceration site wound appears to be well-healed without any evidence of infection. Psychiatric: Mood and affect are normal. Speech and behavior are normal.  ____________________________________________   LABS (all labs ordered are listed, but only abnormal results are displayed)  Labs Reviewed - No data to display  PROCEDURES  Procedure(s) performed: None  Procedures  Critical Care  performed: No  ____________________________________________   INITIAL IMPRESSION / ASSESSMENT AND PLAN / ED COURSE  Pertinent labs & imaging results that were available during my care of the patient were reviewed by me and considered in my medical decision making (see chart for details).  Sutures were removed by the RN. No further treatment was needed. ____________________________________________   FINAL CLINICAL IMPRESSION(S) / ED DIAGNOSES  Final diagnoses:  Encounter for removal of sutures      NEW MEDICATIONS STARTED DURING THIS VISIT:  Discharge Medication List as of 10/31/2015  7:17 AM       Note:  This document was prepared using Dragon voice recognition software and may include unintentional dictation errors.    Tommi RumpsRhonda L Sobia Karger, PA-C 10/31/15 1155  Sharman CheekPhillip Stafford, MD 11/01/15 2027

## 2015-10-31 NOTE — ED Notes (Signed)
PT presents to ED for suture removal to RIGHT thumb. No swelling, redness, or drainage noted.

## 2015-10-31 NOTE — Discharge Instructions (Signed)
Follow-up with your doctor if any continued problems. Continue to clean with mild soap and water daily. Watch for signs of infection.

## 2017-07-06 ENCOUNTER — Encounter (INDEPENDENT_AMBULATORY_CARE_PROVIDER_SITE_OTHER): Payer: Self-pay

## 2017-07-06 ENCOUNTER — Ambulatory Visit: Payer: Self-pay | Attending: Oncology | Admitting: *Deleted

## 2017-07-06 VITALS — BP 127/79 | HR 60 | Temp 98.3°F | Ht 65.0 in | Wt 154.5 lb

## 2017-07-06 DIAGNOSIS — N644 Mastodynia: Secondary | ICD-10-CM

## 2017-07-06 NOTE — Progress Notes (Signed)
Subjective:     Patient ID: Alexis Allen, female   DOB: 05/17/1960, 57 y.o.   MRN: [id number]  HPI   Review of Systems     Objective:   Physical Exam  Pulmonary/Chest: Right breast exhibits tenderness. Right breast exhibits no inverted nipple, no mass, no nipple discharge and no skin change. Left breast exhibits no inverted nipple, no mass, no nipple discharge, no skin change and no tenderness. Breasts are symmetrical.    Targeted breast pain at 3:00 lateral right breast       Assessment:     57 [facility] White [facility]  Patient complains of right breast pain at 3:00 lateral breast.  States it has been present for about 5 years.  States in the past few months the pain has gotten worse.  Describes as a "stabbing uncomfortable" pain.  On clinical breast exam there is no dominant mass, skin changes, nipple discharge or lymphadenopathy.  I can palpate the patients ribs on the lateral right breast, but not at the lateral left breast.  Patient has a family history of ovarian cancer in her mom in her 5660's.  Mom died in [id number].  No family history of breast cancer.  Taught self breast exam.  Reviewed increased risk of breast cancer due to family history of ovarian cancer.  Encouraged annual screening.  Patient has been screened for eligibility.  She does not have any insurance, Medicare or Medicaid.  She also meets financial eligibility.  Hand-out given on the Affordable Care Act.     Plan:     Since the patient is very concerned about the area of right breast pain, and family history of ovarian cancer,  I will go ahead and get bilateral diagnostic mammogram and ultrasound.  Will follow-up per [facility] protocol.

## 2017-07-06 NOTE — Patient Instructions (Signed)
Gave patient hand-out, Women Staying Healthy, Active and Well from [facility], with education on breast health, pap smears, heart and colon health. 

## 2017-07-15 ENCOUNTER — Ambulatory Visit
Admission: RE | Admit: 2017-07-15 | Discharge: 2017-07-15 | Disposition: A | Discharge status: Home / Self Care | Payer: Self-pay | Source: Ambulatory Visit | Attending: Oncology | Admitting: Oncology

## 2017-07-15 DIAGNOSIS — N644 Mastodynia: Secondary | ICD-10-CM

## 2017-07-19 ENCOUNTER — Encounter: Payer: Self-pay | Admitting: *Deleted

## 2017-07-19 NOTE — Progress Notes (Signed)
Patient with normal findings on imaging.  Letter mailed to patient with an appointment for 09/14/17 @ 11:30 for repeat breast exam to reassess her pain.  HSIS to [place].

## 2017-09-14 ENCOUNTER — Ambulatory Visit: Payer: Self-pay

## 2018-08-23 ENCOUNTER — Other Ambulatory Visit: Payer: Self-pay | Admitting: Family Medicine

## 2018-08-23 ENCOUNTER — Ambulatory Visit
Admission: RE | Admit: 2018-08-23 | Discharge: 2018-08-23 | Disposition: A | Discharge status: Home / Self Care | Payer: Self-pay | Source: Ambulatory Visit | Attending: Family Medicine | Admitting: Family Medicine

## 2018-08-23 ENCOUNTER — Other Ambulatory Visit: Payer: Self-pay

## 2018-08-23 DIAGNOSIS — N200 Calculus of kidney: Secondary | ICD-10-CM

## 2018-08-24 ENCOUNTER — Ambulatory Visit: Payer: Self-pay

## 2018-11-07 ENCOUNTER — Encounter: Payer: Self-pay | Admitting: Podiatry

## 2018-11-07 ENCOUNTER — Ambulatory Visit (INDEPENDENT_AMBULATORY_CARE_PROVIDER_SITE_OTHER): Payer: Self-pay

## 2018-11-07 ENCOUNTER — Ambulatory Visit (INDEPENDENT_AMBULATORY_CARE_PROVIDER_SITE_OTHER): Payer: Self-pay | Admitting: Podiatry

## 2018-11-07 ENCOUNTER — Other Ambulatory Visit: Payer: Self-pay

## 2018-11-07 VITALS — Temp 98.2°F

## 2018-11-07 DIAGNOSIS — M722 Plantar fascial fibromatosis: Secondary | ICD-10-CM

## 2018-11-07 DIAGNOSIS — M7752 Other enthesopathy of left foot: Secondary | ICD-10-CM

## 2018-11-10 NOTE — Progress Notes (Signed)
   Subjective: 58 y.o. female presenting today as a new patient with a chief complaint of aching, throbbing pain of the bilateral feet that began a few years ago. He states it sometimes feels as if he has nodules on the left arch. He has been using inserts, purchased new shoes, taking Ibuprofen and elevating the feet for treatment. Walking and standing on his feet for long periods of time increases the pain. Patient is here for further evaluation and treatment.   Past Medical History:  Diagnosis Date  . Arthritis    LOWER BACK AND NECK  . Hemorrhoids   . Hypertension      Objective: Physical Exam General: The patient is alert and oriented x3 in no acute distress.  Dermatology: Skin is warm, dry and supple bilateral lower extremities. Negative for open lesions or macerations bilateral.   Vascular: Dorsalis Pedis and Posterior Tibial pulses palpable bilateral.  Capillary fill time is immediate to all digits.  Neurological: Epicritic and protective threshold intact bilateral.   Musculoskeletal: Tenderness to palpation to the plantar aspect of the right heel along the plantar fascia as well as to the 2nd MPJ of the left foot. Palpable nodule noted to the plantar medial longitudinal arch of the left foot. Pain with palpation also noted to the area. All other joints range of motion within normal limits bilateral. Strength 5/5 in all groups bilateral.   Radiographic exam: Normal osseous mineralization. Joint spaces preserved. No fracture/dislocation/boney destruction. No other soft tissue abnormalities or radiopaque foreign bodies.   Assessment: 1. Plantar fasciitis right 2. 2nd MPJ capsulitis left  3. Plantar fibroma left   Plan of Care:  1. Patient evaluated. Xrays reviewed.   2. Injection of 0.5cc Celestone soluspan injected into the right plantar fascia  3. Injection of 0.5 mLs Celestone Soluspan injected into the 2nd MPJ of the left foot.  4. Injection of 0.5 mLs Celestone  Soluspan injected into the plantar fibroma of the left foot.  5. Continue taking Celebrex 100 mg daily.  6. Compression anklets dispensed bilaterally.  7. Continue wearing Saucony shoes with Aetrex insoles.  8. Return to clinic in 4 weeks.    Edrick Kins, DPM [facility]  Dr. Edrick Kins, DPM    [address], [place] [address]                Office [phone]  Fax [phone]

## 2018-12-05 ENCOUNTER — Ambulatory Visit (INDEPENDENT_AMBULATORY_CARE_PROVIDER_SITE_OTHER): Payer: Self-pay | Admitting: Podiatry

## 2018-12-05 ENCOUNTER — Other Ambulatory Visit: Payer: Self-pay

## 2018-12-05 ENCOUNTER — Encounter: Payer: Self-pay | Admitting: Podiatry

## 2018-12-05 VITALS — Temp 97.3°F

## 2018-12-05 DIAGNOSIS — M722 Plantar fascial fibromatosis: Secondary | ICD-10-CM

## 2018-12-08 NOTE — Progress Notes (Signed)
   Subjective: 58 y.o. female presenting today for follow up evaluation of bilateral foot pain. She states her pain had resolved for about three weeks after receiving the injections but has now returned. She states the feet are the most painful after being on them all day. She has been taking Celebrex as directed. Patient is here for further evaluation and treatment.   Past Medical History:  Diagnosis Date  . Arthritis    LOWER BACK AND NECK  . Hemorrhoids   . Hypertension      Objective: Physical Exam General: The patient is alert and oriented x3 in no acute distress.  Dermatology: Skin is warm, dry and supple bilateral lower extremities. Negative for open lesions or macerations bilateral.   Vascular: Dorsalis Pedis and Posterior Tibial pulses palpable bilateral.  Capillary fill time is immediate to all digits.  Neurological: Epicritic and protective threshold intact bilateral.   Musculoskeletal: Tenderness to palpation to the plantar aspect of the right heel along the plantar fascia. Palpable nodule noted to the plantar medial longitudinal arch of the left foot. Pain with palpation also noted to the area. All other joints range of motion within normal limits bilateral. Strength 5/5 in all groups bilateral.   Assessment: 1. Plantar fasciitis right 2. 2nd MPJ capsulitis left - resolved  3. Plantar fibroma left   Plan of Care:  1. Patient evaluated.    2. Injection of 0.5cc Celestone soluspan injected into the right plantar fascia  3. Injection of 0.5 mLs Celestone Soluspan injected into the plantar fibroma of the left foot.  4. Powerstep insoles provided and dispensed today.  5. Patient may need custom orthotics in the future.  6. Continue taking Celebrex 100 mg daily.  7. Return to clinic as needed.     Edrick Kins, DPM [facility]  Dr. Edrick Kins, DPM    [address], [place] [address]                 Office [phone]  Fax [phone]

## 2019-01-26 ENCOUNTER — Ambulatory Visit: Payer: Self-pay | Admitting: Podiatry

## 2019-01-30 ENCOUNTER — Encounter: Payer: Self-pay | Admitting: Podiatry

## 2019-01-30 ENCOUNTER — Ambulatory Visit (INDEPENDENT_AMBULATORY_CARE_PROVIDER_SITE_OTHER): Payer: Self-pay | Admitting: Podiatry

## 2019-01-30 ENCOUNTER — Other Ambulatory Visit: Payer: Self-pay

## 2019-01-30 DIAGNOSIS — M722 Plantar fascial fibromatosis: Secondary | ICD-10-CM

## 2019-01-30 DIAGNOSIS — G629 Polyneuropathy, unspecified: Secondary | ICD-10-CM

## 2019-01-30 MED ORDER — METHYLPREDNISOLONE 4 MG PO TBPK: ORAL_TABLET | ORAL | 0 refills | Status: DC

## 2019-01-30 MED ORDER — GABAPENTIN 300 MG PO CAPS: 300.0000 mg | ORAL_CAPSULE | Freq: Two times a day (BID) | ORAL | 3 refills | Status: DC

## 2019-02-02 NOTE — Progress Notes (Signed)
   Subjective: 58 y.o. female presenting today for follow up evaluation of bilateral foot pain. She states her symptoms have improved significantly but today she is experiencing some moderate pain due to standing on concrete floors all day. She has been taking Celebrex and using the orthotics as directed. Patient is here for further evaluation and treatment.   Past Medical History:  Diagnosis Date  . Arthritis    LOWER BACK AND NECK  . Hemorrhoids   . Hypertension      Objective: Physical Exam General: The patient is alert and oriented x3 in no acute distress.  Dermatology: Skin is warm, dry and supple bilateral lower extremities. Negative for open lesions or macerations bilateral.   Vascular: Dorsalis Pedis and Posterior Tibial pulses palpable bilateral.  Capillary fill time is immediate to all digits.  Neurological: Epicritic and protective threshold diminished bilateral.   Musculoskeletal: Tenderness to palpation to the plantar aspect of the bilateral heels along the plantar fascia. All other joints range of motion within normal limits bilateral. Strength 5/5 in all groups bilateral.    Assessment: 1. plantar fasciitis bilateral feet 2. Peripheral neuropathy BLE 3. Lumbar radiculopathy   Plan of Care:  1. Patient evaluated.    2. Injection of 0.5cc Celestone soluspan injected into the bilateral heels.  3. Rx for Medrol Dose Pak placed 4. Increase Gabapentin to 300 mg BID.  5. Continue taking Celebrex 100 mg daily. 6. Continue using OTC insoles.  7. Return to clinic as needed.     Edrick Kins, DPM [facility]  Dr. Edrick Kins, DPM    [address], [place] [address]                Office [phone]  Fax [phone]

## 2019-06-05 NOTE — Progress Notes (Signed)
Patient pre-screened for [facility] eligibility due to COVID 19 precautions. Two patient identifiers used for verification that I was speaking to correct patient.  Patient to present to [facility] at 3:00 on 06/06/19.

## 2019-06-06 ENCOUNTER — Encounter (INDEPENDENT_AMBULATORY_CARE_PROVIDER_SITE_OTHER): Payer: Self-pay

## 2019-06-06 ENCOUNTER — Ambulatory Visit
Admission: RE | Admit: 2019-06-06 | Discharge: 2019-06-06 | Disposition: A | Discharge status: Home / Self Care | Payer: Self-pay | Source: Ambulatory Visit | Attending: Oncology | Admitting: Oncology

## 2019-06-06 ENCOUNTER — Ambulatory Visit: Payer: Self-pay | Attending: Oncology

## 2019-06-06 ENCOUNTER — Other Ambulatory Visit: Payer: Self-pay

## 2019-06-06 VITALS — BP 154/86 | HR 75 | Temp 98.0°F | Resp 18 | Ht 64.5 in | Wt 173.4 lb

## 2019-06-06 DIAGNOSIS — Z0000 Encounter for general adult medical examination without abnormal findings: Secondary | ICD-10-CM

## 2019-06-06 NOTE — Progress Notes (Signed)
  Subjective:     Patient ID: Alexis Allen, female   DOB: [birth year], 60 y.o.   MRN: [id number]  HPI   Review of Systems     Objective:   Physical Exam Chest:     Breasts:        Right: No swelling, bleeding, inverted nipple, mass, nipple discharge, skin change or tenderness.        Left: No swelling, bleeding, inverted nipple, mass, nipple discharge, skin change or tenderness.  Genitourinary:    Labia:        Right: No rash, tenderness, lesion or injury.        Left: No rash, tenderness, lesion or injury.      Vagina: No signs of injury and foreign body. No vaginal discharge, erythema, tenderness, bleeding, lesions or prolapsed vaginal walls.     Cervix: No cervical motion tenderness, discharge, friability, lesion, erythema, cervical bleeding or eversion.     Uterus: Deviated. Not enlarged, not fixed and not tender.      Adnexa:        Right: No mass, tenderness or fullness.         Left: No mass, tenderness or fullness.       Comments: Retroverted cervix       Assessment:     59 year old patient presents for [facility] clinic visit.  Patient screened, and meets [facility] eligibility.  Patient does not have insurance, Medicare or Medicaid. Instructed patient on breast self awareness using teach back method.  Clinical breast exam unremarkable.  No mass or lump palpated. Pelvic exam normal.    Plan:     Sent for bilateral screening mammogram.  Specimen collected for pap.

## 2019-06-07 NOTE — Progress Notes (Signed)
Letter mailed from [facility] to notify of normal mammogram results.  Pap results pending. Phoned patient with normal pap results.  To return for annual [facility] screening in one year. Copy to [place].

## 2019-06-09 LAB — IGP, APTIMA HPV: HPV Aptima: NEGATIVE

## 2019-07-03 ENCOUNTER — Ambulatory Visit: Payer: Self-pay | Admitting: Podiatry

## 2019-07-13 ENCOUNTER — Encounter: Payer: Self-pay | Admitting: Podiatry

## 2019-07-13 ENCOUNTER — Ambulatory Visit (INDEPENDENT_AMBULATORY_CARE_PROVIDER_SITE_OTHER): Payer: Self-pay | Admitting: Podiatry

## 2019-07-13 ENCOUNTER — Other Ambulatory Visit: Payer: Self-pay

## 2019-07-13 VITALS — Temp 97.3°F

## 2019-07-13 DIAGNOSIS — M7752 Other enthesopathy of left foot: Secondary | ICD-10-CM

## 2019-07-13 DIAGNOSIS — M722 Plantar fascial fibromatosis: Secondary | ICD-10-CM

## 2019-07-13 DIAGNOSIS — G629 Polyneuropathy, unspecified: Secondary | ICD-10-CM

## 2019-07-13 MED ORDER — GABAPENTIN 300 MG PO CAPS: 300.0000 mg | ORAL_CAPSULE | Freq: Two times a day (BID) | ORAL | 3 refills | Status: DC

## 2019-07-17 NOTE — Progress Notes (Signed)
   Subjective: 59 y.o. female presenting today for follow up evaluation of bilateral foot pain. She reports significant continued pain. She reports constant aching pain and associated swelling. Elevating the feet help alleviate that symptoms. She has been taking Gabapentin and using OTC insoles as directed. Patient is here for further evaluation and treatment.   Past Medical History:  Diagnosis Date  . Arthritis    LOWER BACK AND NECK  . Hemorrhoids   . Hypertension      Objective: Physical Exam General: The patient is alert and oriented x3 in no acute distress.  Dermatology: Skin is warm, dry and supple bilateral lower extremities. Negative for open lesions or macerations bilateral.   Vascular: Dorsalis Pedis and Posterior Tibial pulses palpable bilateral.  Capillary fill time is immediate to all digits.  Neurological: Epicritic and protective threshold diminished bilateral.   Musculoskeletal: Tenderness to palpation to the plantar aspect of the bilateral heels along the plantar fascia. All other joints range of motion within normal limits bilateral. Strength 5/5 in all groups bilateral.    Assessment: 1. plantar fasciitis bilateral feet - improved  2. Peripheral neuropathy BLE 3. Lumbar radiculopathy   Plan of Care:  1. Patient evaluated.    2. Appointment with Pedorthist for custom molded orthotics. Continue using OTC insoles for now.  3. Refill prescription for Gabapentin 300 mg BID provided to patient.   4. Continue taking Celebrex 100 mg daily. 5. Return to clinic as needed.     Felecia Shelling, DPM [facility]  Dr. Felecia Shelling, DPM    [address] [place], [place] [address]                Office [phone]  Fax [phone]

## 2019-08-01 ENCOUNTER — Other Ambulatory Visit: Payer: Self-pay | Admitting: Orthotics

## 2019-08-29 ENCOUNTER — Ambulatory Visit: Payer: Self-pay | Admitting: Orthotics

## 2019-08-29 ENCOUNTER — Other Ambulatory Visit: Payer: Self-pay

## 2019-09-07 NOTE — Patient Instructions (Signed)
g

## 2019-09-27 ENCOUNTER — Other Ambulatory Visit: Payer: Self-pay | Admitting: Orthotics

## 2019-10-03 ENCOUNTER — Other Ambulatory Visit: Payer: Self-pay

## 2019-10-03 ENCOUNTER — Ambulatory Visit: Payer: Self-pay | Admitting: Orthotics

## 2019-10-03 DIAGNOSIS — G629 Polyneuropathy, unspecified: Secondary | ICD-10-CM

## 2019-10-03 DIAGNOSIS — M722 Plantar fascial fibromatosis: Secondary | ICD-10-CM

## 2019-10-03 NOTE — Progress Notes (Signed)
re scanned

## 2019-10-10 ENCOUNTER — Ambulatory Visit (INDEPENDENT_AMBULATORY_CARE_PROVIDER_SITE_OTHER): Payer: Self-pay | Admitting: Orthotics

## 2019-10-10 ENCOUNTER — Other Ambulatory Visit: Payer: Self-pay

## 2019-10-10 DIAGNOSIS — G629 Polyneuropathy, unspecified: Secondary | ICD-10-CM

## 2019-10-10 DIAGNOSIS — M722 Plantar fascial fibromatosis: Secondary | ICD-10-CM

## 2019-10-10 NOTE — Progress Notes (Signed)
Patient came in today to pick up custom made foot orthotics.  The goals were accomplished and the patient reported no dissatisfaction with said orthotics.  Patient was advised of breakin period and how to report any issues. 

## 2020-01-31 ENCOUNTER — Telehealth: Payer: Self-pay

## 2020-01-31 MED ORDER — GABAPENTIN 300 MG PO CAPS: 300.0000 mg | ORAL_CAPSULE | Freq: Two times a day (BID) | ORAL | 3 refills | Status: DC

## 2020-01-31 NOTE — Telephone Encounter (Signed)
Patient called requesting a refill of Gabapentin.  Per Dr. Logan Bores verbal order, ok to send refill to pharmacy. Medication has been sent and patient has been notified.

## 2020-02-08 ENCOUNTER — Other Ambulatory Visit: Payer: Self-pay | Admitting: Podiatry

## 2020-02-08 NOTE — Telephone Encounter (Signed)
Please Advise

## 2020-06-11 ENCOUNTER — Other Ambulatory Visit: Payer: Self-pay

## 2020-06-11 ENCOUNTER — Encounter (INDEPENDENT_AMBULATORY_CARE_PROVIDER_SITE_OTHER): Payer: Self-pay

## 2020-06-11 ENCOUNTER — Ambulatory Visit
Admission: RE | Admit: 2020-06-11 | Discharge: 2020-06-11 | Disposition: A | Discharge status: Home / Self Care | Payer: Self-pay | Source: Ambulatory Visit | Attending: Oncology | Admitting: Oncology

## 2020-06-11 ENCOUNTER — Ambulatory Visit: Payer: Self-pay | Attending: Oncology

## 2020-06-11 VITALS — BP 145/76 | HR 66 | Temp 96.7°F | Ht 63.5 in | Wt 173.0 lb

## 2020-06-11 DIAGNOSIS — Z0000 Encounter for general adult medical examination without abnormal findings: Secondary | ICD-10-CM

## 2020-06-11 NOTE — Progress Notes (Signed)
  Subjective:     Patient ID: CYNITHA BERTE, female   DOB: Mar 03, 1961, 60 y.o.   MRN: [id number]  HPI   Review of Systems     Objective:   Physical Exam Chest:  Breasts:     Right: No swelling, bleeding, inverted nipple, mass, nipple discharge, skin change or tenderness.     Left: No swelling, bleeding, inverted nipple, mass, nipple discharge, skin change or tenderness.          Assessment:     60 year old patient returns for [facility] clinic visit.  Patient screened, and meets [facility] eligibility.  Patient does not have insurance, Medicare or Medicaid.  Instructed patient on breast self awareness using teach back method.  Clinical breast exam unremarkable.  No mass or lump.  Risk Assessment    Risk Scores      06/11/2020 06/06/2019   Last edited by: Jim Like, RN Scarlett Presto, RN   5-year risk: 1 % 1 %   Lifetime risk: 5.3 % 5.5 %            Plan:     Sent for bilateral screening mammogram.

## 2020-06-18 NOTE — Progress Notes (Signed)
Letter mailed from [facility] to notify of normal mammogram results.  Patient to return in one year for annual screening.  Copy to [facility]. 

## 2020-12-03 ENCOUNTER — Other Ambulatory Visit: Payer: Self-pay

## 2020-12-03 ENCOUNTER — Emergency Department: Payer: Self-pay

## 2020-12-03 ENCOUNTER — Emergency Department
Admission: EM | Admit: 2020-12-03 | Discharge: 2020-12-03 | Disposition: A | Discharge status: Home / Self Care | Payer: Self-pay | Attending: Emergency Medicine | Admitting: Emergency Medicine

## 2020-12-03 DIAGNOSIS — U071 COVID-19: Secondary | ICD-10-CM | POA: Insufficient documentation

## 2020-12-03 DIAGNOSIS — Z87891 Personal history of nicotine dependence: Secondary | ICD-10-CM | POA: Insufficient documentation

## 2020-12-03 DIAGNOSIS — Z79899 Other long term (current) drug therapy: Secondary | ICD-10-CM | POA: Insufficient documentation

## 2020-12-03 DIAGNOSIS — I10 Essential (primary) hypertension: Secondary | ICD-10-CM | POA: Insufficient documentation

## 2020-12-03 LAB — RESP PANEL BY RT-PCR (FLU A&B, COVID) ARPGX2
Influenza A by PCR: NEGATIVE
Influenza B by PCR: NEGATIVE
SARS Coronavirus 2 by RT PCR: POSITIVE — AB

## 2020-12-03 LAB — CBC WITH DIFFERENTIAL/PLATELET
Abs Immature Granulocytes: 0.03 10*3/uL (ref 0.00–0.07)
Basophils Absolute: 0.1 10*3/uL (ref 0.0–0.1)
Basophils Relative: 1 %
Eosinophils Absolute: 0.1 10*3/uL (ref 0.0–0.5)
Eosinophils Relative: 2 %
HCT: 36.8 % (ref 36.0–46.0)
Hemoglobin: 12.6 g/dL (ref 12.0–15.0)
Immature Granulocytes: 0 %
Lymphocytes Relative: 7 %
Lymphs Abs: 0.5 10*3/uL — ABNORMAL LOW (ref 0.7–4.0)
MCH: 30.9 pg (ref 26.0–34.0)
MCHC: 34.2 g/dL (ref 30.0–36.0)
MCV: 90.2 fL (ref 80.0–100.0)
Monocytes Absolute: 0.6 10*3/uL (ref 0.1–1.0)
Monocytes Relative: 8 %
Neutro Abs: 6 10*3/uL (ref 1.7–7.7)
Neutrophils Relative %: 82 %
Platelets: 198 10*3/uL (ref 150–400)
RBC: 4.08 MIL/uL (ref 3.87–5.11)
RDW: 12.3 % (ref 11.5–15.5)
WBC: 7.3 10*3/uL (ref 4.0–10.5)
nRBC: 0 % (ref 0.0–0.2)

## 2020-12-03 LAB — TROPONIN I (HIGH SENSITIVITY): Troponin I (High Sensitivity): 5 ng/L (ref <18)

## 2020-12-03 LAB — URINALYSIS, COMPLETE (UACMP) WITH MICROSCOPIC
Bilirubin Urine: NEGATIVE
Glucose, UA: NEGATIVE mg/dL
Ketones, ur: NEGATIVE mg/dL
Nitrite: NEGATIVE
Protein, ur: NEGATIVE mg/dL
Specific Gravity, Urine: 1.024 (ref 1.005–1.030)
pH: 5 (ref 5.0–8.0)

## 2020-12-03 LAB — COMPREHENSIVE METABOLIC PANEL
ALT: 16 U/L (ref 0–44)
AST: 27 U/L (ref 15–41)
Albumin: 3.7 g/dL (ref 3.5–5.0)
Alkaline Phosphatase: 47 U/L (ref 38–126)
Anion gap: 10 (ref 5–15)
BUN: 15 mg/dL (ref 8–23)
CO2: 28 mmol/L (ref 22–32)
Calcium: 8.9 mg/dL (ref 8.9–10.3)
Chloride: 100 mmol/L (ref 98–111)
Creatinine, Ser: 0.71 mg/dL (ref 0.44–1.00)
GFR, Estimated: >60 mL/min (ref >60)
Glucose, Bld: 125 mg/dL — ABNORMAL HIGH (ref 70–99)
Potassium: 3.5 mmol/L (ref 3.5–5.1)
Sodium: 138 mmol/L (ref 135–145)
Total Bilirubin: 0.6 mg/dL (ref 0.3–1.2)
Total Protein: 6.9 g/dL (ref 6.5–8.1)

## 2020-12-03 LAB — LACTIC ACID, PLASMA: Lactic Acid, Venous: 1.8 mmol/L (ref 0.5–1.9)

## 2020-12-03 LAB — LIPASE, BLOOD: Lipase: 25 U/L (ref 11–51)

## 2020-12-03 MED ORDER — PAXLOVID 10 X 150 MG & 10 X 100MG PO TBPK: 2.0000 | ORAL_TABLET | Freq: Two times a day (BID) | ORAL | 0 refills | Status: AC

## 2020-12-03 MED ORDER — ONDANSETRON 4 MG PO TBDP
4.0000 mg | ORAL_TABLET | Freq: Once | ORAL | Status: AC | PRN
  Administered 2020-12-03: 4 mg via ORAL
  Filled 2020-12-03: qty 1

## 2020-12-03 MED ORDER — ACETAMINOPHEN 325 MG PO TABS
650.0000 mg | ORAL_TABLET | Freq: Once | ORAL | Status: AC | PRN
  Administered 2020-12-03: 650 mg via ORAL
  Filled 2020-12-03: qty 2

## 2020-12-03 NOTE — ED Notes (Signed)
6767 lactic

## 2020-12-03 NOTE — ED Triage Notes (Signed)
Pt arrived via POV with reports of fever x 1 day, body aches, nausea. Unknown if any covid exposure, took home test yesterday that was negative.

## 2020-12-03 NOTE — Discharge Instructions (Addendum)
Continue to monitor and treat any fevers with Tylenol and Motrin.

## 2020-12-03 NOTE — ED Provider Notes (Signed)
[facility] Emergency Department Provider Note   ____________________________________________   Event Date/Time   First MD Initiated Contact with Patient 12/03/20 [phone]     (approximate)  I have reviewed the triage vital signs and the nursing notes.   HISTORY  Chief Complaint Fever and Generalized Body Aches   HPI Alexis Allen is a 60 y.o. female with a history of arthritis and hypertension, presents to the ED with 1 day complaint of fevers, chills, and body aches.  Patient is unaware of any known exposures to COVID, but did take a home test that was negative on yesterday. She  has not been vaccinated for Covid or infleunza.   Past Medical History:  Diagnosis Date   Arthritis    LOWER BACK AND NECK   Hemorrhoids    Hypertension     Patient Active Problem List   Diagnosis Date Noted   Fourth degree hemorrhoids    Prolapsed internal hemorrhoids, grade 4 09/12/2015    Past Surgical History:  Procedure Laterality Date   CHOLECYSTECTOMY     DILATION AND CURETTAGE OF UTERUS     EVALUATION UNDER ANESTHESIA WITH HEMORRHOIDECTOMY N/A 09/19/2015   Procedure: EXAM UNDER ANESTHESIA WITH HEMORRHOIDECTOMY;  Surgeon: Ricarda Frame, MD;  Location: [facility] ORS;  Service: General;  Laterality: N/A;   HEMORRHOID SURGERY     TONSILLECTOMY     TUBAL LIGATION      Prior to Admission medications   Medication Sig Start Date End Date Taking? Authorizing Provider  nirmatrelvir/ritonavir EUA, renal dosing, (PAXLOVID) TBPK Take 2 tablets by mouth 2 (two) times daily for 5 days. Patient GFR is WNL. Take nirmatrelvir (150 mg) one tablet twice daily for 5 days and ritonavir (100 mg) one tablet twice daily for 5 days. 12/03/20 12/08/20 Yes Katrianna Friesenhahn, Charlesetta Ivory, PA-C  celecoxib (CELEBREX) 50 MG capsule Take 50 mg by mouth at bedtime.    [provider]  Docusate Calcium (STOOL SOFTENER PO) Take 2 capsules by mouth 2 (two) times daily.     [provider]   gabapentin (NEURONTIN) 300 MG capsule TAKE 1 CAPSULE(300 MG) BY MOUTH TWICE DAILY 02/08/20   Felecia Shelling, DPM  hydrochlorothiazide (HYDRODIURIL) 25 MG tablet Take 25 mg by mouth every morning.    [provider]  losartan (COZAAR) 50 MG tablet Take 50 mg by mouth every evening.    [provider]    Allergies Patient has no known allergies.  Family History  Problem Relation Age of Onset   Cancer Mother        ovarian   Hypertension Father    Stroke Father    Breast cancer Neg Hx     Social History Social History   Tobacco Use   Smoking status: Former    Packs/day: 0.25    Years: 25.00    Pack years: 6.25    Types: Cigarettes    Quit date: 06/25/2015    Years since quitting: 5.4   Smokeless tobacco: Never  Substance Use Topics   Alcohol use: Yes    Comment: RARE   Drug use: No    Review of Systems  Constitutional: Reports fever/chills Eyes: No visual changes. ENT: No sore throat. Cardiovascular: Denies chest pain. Respiratory: Denies shortness of breath. Gastrointestinal: No abdominal pain.  Reports nausea, no vomiting.  No diarrhea.  No constipation. Genitourinary: Negative for dysuria. Musculoskeletal: Negative for back pain. Skin: Negative for rash. Neurological: Negative for headaches, focal weakness or numbness.  ____________________________________________  PHYSICAL EXAM:  VITAL SIGNS: ED Triage Vitals  Enc Vitals Group     BP 12/03/20 0628 121/75     Pulse Rate 12/03/20 0628 86     Resp 12/03/20 0628 20     Temp 12/03/20 0628 (!) 101.2 F (38.4 C)     Temp Source 12/03/20 0628 Oral     SpO2 12/03/20 0628 95 %     Weight 12/03/20 0628 165 lb (74.8 kg)     Height 12/03/20 0628 5\' 4"  (1.626 m)     Head Circumference --      Peak Flow --      Pain Score 12/03/20 0633 8     Pain Loc --      Pain Edu? --      Excl. in GC? --     Constitutional: Alert and oriented. Well appearing and in no acute distress. Eyes:  Conjunctivae are normal. PERRL. EOMI. Head: Atraumatic. Nose: No congestion/rhinnorhea. Mouth/Throat: Mucous membranes are moist.  Oropharynx non-erythematous. Neck: No stridor.   Cardiovascular: Normal rate, regular rhythm. Grossly normal heart sounds.  Good peripheral circulation. Respiratory: Normal respiratory effort.  No retractions. Lungs CTAB. Gastrointestinal: Soft and nontender. No distention. No abdominal bruits. No CVA tenderness. Musculoskeletal: No lower extremity tenderness nor edema.  No joint effusions. Neurologic:  Normal speech and language. No gross focal neurologic deficits are appreciated. No gait instability. Skin:  Skin is warm, dry and intact. No rash noted. Psychiatric: Mood and affect are normal. Speech and behavior are normal.  ____________________________________________   LABS (all labs ordered are listed, but only abnormal results are displayed)  Labs Reviewed  RESP PANEL BY RT-PCR (FLU A&B, COVID) ARPGX2 - Abnormal; Notable for the following components:      Result Value   SARS Coronavirus 2 by RT PCR POSITIVE (*)    All other components within normal limits  COMPREHENSIVE METABOLIC PANEL - Abnormal; Notable for the following components:   Glucose, Bld 125 (*)    All other components within normal limits  CBC WITH DIFFERENTIAL/PLATELET - Abnormal; Notable for the following components:   Lymphs Abs 0.5 (*)    All other components within normal limits  URINALYSIS, COMPLETE (UACMP) WITH MICROSCOPIC - Abnormal; Notable for the following components:   Color, Urine YELLOW (*)    APPearance HAZY (*)    Hgb urine dipstick LARGE (*)    Leukocytes,Ua TRACE (*)    Bacteria, UA FEW (*)    All other components within normal limits  LACTIC ACID, PLASMA  LIPASE, BLOOD  TROPONIN I (HIGH SENSITIVITY)   ____________________________________________  EKG   ____________________________________________  RADIOLOGY I, 02/02/21, personally viewed  and evaluated these images (plain radiographs) as part of my medical decision making, as well as reviewing the written report by the radiologist.  ED MD interpretation:  agree with report  Official radiology report(s): DG Chest 2 View  Result Date: 12/03/2020 CLINICAL DATA:  60 year old female with fever.  Former smoker. EXAM: CHEST - 2 VIEW COMPARISON:  CT Abdomen and Pelvis 08/23/2018. FINDINGS: Lung volumes and mediastinal contours are within normal limits. Visualized tracheal air column is within normal limits. Mild diffuse increased pulmonary interstitial markings in both lungs are likely smoking related. No pneumothorax, pulmonary edema, pleural effusion or confluent pulmonary opacity. Mild scoliosis and exaggerated kyphosis at the thoracolumbar junction. Stable cholecystectomy clips. Negative visible bowel gas pattern. IMPRESSION: Probably smoking related mild symmetrically increased pulmonary interstitial markings. In the setting of fever viral/atypical respiratory infection is  felt less likely. Electronically Signed   By: Odessa Fleming M.D.   On: 12/03/2020 07:23    ____________________________________________   PROCEDURES  Procedure(s) performed (including Critical Care):  Procedures   ____________________________________________   INITIAL IMPRESSION / ASSESSMENT AND PLAN / ED COURSE  As part of my medical decision making, I reviewed the following data within the electronic MEDICAL RECORD NUMBER Labs reviewed WNL, Radiograph reviewed as above, and Notes from prior ED visits    DDX: CAP, influenza, Covid, sepsis  Patient with ED evaluation of symptoms including fever and body aches patient was evaluated for complaint in ED, and found to have overall reassuring exam.  Temperature normalized after antipyretics.  Her viral screen did confirm COVID.  Patient is inclined to take age course of Paxlovid for her symptoms.  She be discharged with a prescription and instructions to continue to  monitor and treat fevers.  She will follow with primary provider return to ED if needed.  Work note is provided as is appropriate.  ____________________________________________   FINAL CLINICAL IMPRESSION(S) / ED DIAGNOSES  Final diagnoses:  COVID-19     ED Discharge Orders          Ordered    nirmatrelvir/ritonavir EUA, renal dosing, (PAXLOVID) TBPK  2 times daily        12/03/20 2637             Note:  This document was prepared using Dragon voice recognition software and may include unintentional dictation errors.    Lissa Hoard, PA-C 12/03/20 [id number]    Georga Hacking, MD 12/03/20 [phone]

## 2021-06-09 ENCOUNTER — Other Ambulatory Visit: Payer: Self-pay

## 2021-06-09 DIAGNOSIS — Z1231 Encounter for screening mammogram for malignant neoplasm of breast: Secondary | ICD-10-CM

## 2021-07-07 ENCOUNTER — Ambulatory Visit: Payer: Self-pay

## 2021-09-09 ENCOUNTER — Other Ambulatory Visit: Payer: Self-pay

## 2021-09-09 DIAGNOSIS — Z1211 Encounter for screening for malignant neoplasm of colon: Secondary | ICD-10-CM

## 2021-09-15 ENCOUNTER — Ambulatory Visit
Admission: RE | Admit: 2021-09-15 | Discharge: 2021-09-15 | Disposition: A | Discharge status: Home / Self Care | Payer: Self-pay | Source: Ambulatory Visit | Attending: Obstetrics and Gynecology | Admitting: Obstetrics and Gynecology

## 2021-09-15 ENCOUNTER — Ambulatory Visit: Payer: Self-pay | Attending: Hematology and Oncology | Admitting: *Deleted

## 2021-09-15 DIAGNOSIS — Z1231 Encounter for screening mammogram for malignant neoplasm of breast: Secondary | ICD-10-CM | POA: Insufficient documentation

## 2021-09-15 NOTE — Progress Notes (Signed)
[name] is a 61 y.o. female who presents to [facility] clinic today with complaints of vaginal dryness. She presents today for her well woman visit. She was encouraged to try a vaginal moisturizer and a silicone based lubricant.   Pap Smear: Pap not smear completed today. Last Pap smear was 06/11/20 at [facility] clinic and was normal with negative / negative results. Per patient has no history of an abnormal Pap smear. Last Pap smear result is available in Epic for review.   Physical exam: Breasts Breasts symmetrical. No skin abnormalities bilateral breasts. No nipple retraction bilateral breasts. No nipple discharge bilateral breasts. No lymphadenopathy. No lumps palpated bilateral breasts.       Pelvic/Bimanual Pap is not indicated today    Smoking History: Patient has is a former smoker.    Patient Navigation: Patient education provided. Access to services provided for patient through [facility] program. No interpreter provided. No transportation provided   Colorectal Cancer Screening: Per patient she has never had a colonoscopy.  She refuses FIT test today.  No complaints today.    Breast and Cervical Cancer Risk Assessment: Patient does not have family history of breast cancer, known genetic mutations, or radiation treatment to the chest before age 37. Patient does not have history of cervical dysplasia, immunocompromised, or DES exposure in-utero.  Risk Assessment     Risk Scores       09/15/2021 06/11/2020   Last edited by: Lesle Chris, RN Jim Like, RN   5-year risk: 1.1 % 1 %   Lifetime risk: 5 % 5.3 %            A: [facility] exam without pap smear Complaint of vaginal dryness  P: Referred patient to the [facility] for a screening mammogram. Appointment scheduled today.  Jim Like, RN 09/15/2021 2:02 PM

## 2021-10-07 ENCOUNTER — Ambulatory Visit: Payer: Self-pay

## 2022-08-18 ENCOUNTER — Other Ambulatory Visit: Payer: Self-pay

## 2022-08-18 DIAGNOSIS — Z1231 Encounter for screening mammogram for malignant neoplasm of breast: Secondary | ICD-10-CM

## 2022-09-20 ENCOUNTER — Ambulatory Visit: Payer: Self-pay

## 2022-09-27 ENCOUNTER — Ambulatory Visit: Payer: Self-pay | Attending: Obstetrics and Gynecology

## 2022-09-27 ENCOUNTER — Ambulatory Visit: Payer: Self-pay | Attending: Hematology and Oncology

## 2022-09-27 ENCOUNTER — Telehealth: Payer: Self-pay | Admitting: Hematology and Oncology

## 2022-10-25 ENCOUNTER — Ambulatory Visit
Admission: RE | Admit: 2022-10-25 | Discharge: 2022-10-25 | Disposition: A | Discharge status: Home / Self Care | Payer: Self-pay | Source: Ambulatory Visit | Attending: Obstetrics and Gynecology | Admitting: Obstetrics and Gynecology

## 2022-10-25 ENCOUNTER — Ambulatory Visit: Payer: Self-pay | Attending: Hematology and Oncology | Admitting: Hematology and Oncology

## 2022-10-25 VITALS — Wt 171.0 lb

## 2022-10-25 DIAGNOSIS — Z1211 Encounter for screening for malignant neoplasm of colon: Secondary | ICD-10-CM

## 2022-10-25 DIAGNOSIS — Z1231 Encounter for screening mammogram for malignant neoplasm of breast: Secondary | ICD-10-CM | POA: Insufficient documentation

## 2022-10-25 NOTE — Progress Notes (Signed)
Alexis Allen is a 62 y.o. female who presents to [facility] clinic today with no complaints.    Pap Smear: Pap not smear completed today. Last Pap smear was 06/06/2019 at [facility] clinic and was normal. Per patient has no history of an abnormal Pap smear. Last Pap smear result is available in Epic.   Physical exam: Breasts Breasts symmetrical. No skin abnormalities bilateral breasts. No nipple retraction bilateral breasts. No nipple discharge bilateral breasts. No lymphadenopathy. No lumps palpated bilateral breasts.   MS DIGITAL SCREENING TOMO BILATERAL  Result Date: 09/16/2021 CLINICAL DATA:  Screening. EXAM: DIGITAL SCREENING BILATERAL MAMMOGRAM WITH TOMOSYNTHESIS AND CAD TECHNIQUE: Bilateral screening digital craniocaudal and mediolateral oblique mammograms were obtained. Bilateral screening digital breast tomosynthesis was performed. The images were evaluated with computer-aided detection. COMPARISON:  Previous exam(s). ACR Breast Density Category b: There are scattered areas of fibroglandular density. FINDINGS: There are no findings suspicious for malignancy. IMPRESSION: No mammographic evidence of malignancy. A result letter of this screening mammogram will be mailed directly to the patient. RECOMMENDATION: Screening mammogram in one year. (Code:SM-B-01Y) BI-RADS CATEGORY  1: Negative. Electronically Signed   By: Baird Lyons M.D.   On: 09/16/2021 15:14   MS DIGITAL SCREENING TOMO BILATERAL  Result Date: 06/16/2020 CLINICAL DATA:  Screening. EXAM: DIGITAL SCREENING BILATERAL MAMMOGRAM WITH TOMOSYNTHESIS AND CAD TECHNIQUE: Bilateral screening digital craniocaudal and mediolateral oblique mammograms were obtained. Bilateral screening digital breast tomosynthesis was performed. The images were evaluated with computer-aided detection. COMPARISON:  Previous exam(s). ACR Breast Density Category b: There are scattered areas of fibroglandular density. FINDINGS: There are no findings suspicious for  malignancy. IMPRESSION: No mammographic evidence of malignancy. A result letter of this screening mammogram will be mailed directly to the patient. RECOMMENDATION: Screening mammogram in one year. (Code:SM-B-01Y) BI-RADS CATEGORY  1: Negative. Electronically Signed   By: Bary Richard M.D.   On: 06/16/2020 10:28   MS DIGITAL SCREENING TOMO BILATERAL  Result Date: 06/07/2019 CLINICAL DATA:  Screening. EXAM: DIGITAL SCREENING BILATERAL MAMMOGRAM WITH TOMO AND CAD COMPARISON:  Previous exam(s). ACR Breast Density Category b: There are scattered areas of fibroglandular density. FINDINGS: There are no findings suspicious for malignancy. Images were processed with CAD. IMPRESSION: No mammographic evidence of malignancy. A result letter of this screening mammogram will be mailed directly to the patient. RECOMMENDATION: Screening mammogram in one year. (Code:SM-B-01Y) BI-RADS CATEGORY  1: Negative. Electronically Signed   By: Baird Lyons M.D.   On: 06/07/2019 11:17        Pelvic/Bimanual Pap is not indicated today    Smoking History: Patient has never smoked and was not referred to quit line.    Patient Navigation: Patient education provided. Access to services provided for patient through [facility] program. No interpreter provided. No transportation provided   Colorectal Cancer Screening: Per patient has never had colonoscopy completed No complaints today. FIT test given.    Breast and Cervical Cancer Risk Assessment: Patient does not have family history of breast cancer, known genetic mutations, or radiation treatment to the chest before age 17. Patient does not have history of cervical dysplasia, immunocompromised, or DES exposure in-utero.  Risk Assessment   No risk assessment data for the current encounter  Risk Scores       09/15/2021   Last edited by: Lesle Chris, RN   5-year risk: 1.1 %   Lifetime risk: 5 %            A: [facility] exam without pap smear No complaints  with benign  exam.   P: Referred patient to the [facility] for a screening mammogram. Appointment scheduled 10/25/22.  Pascal Lux, NP 10/25/2022 2:59 PM

## 2022-10-25 NOTE — Patient Instructions (Signed)
Taught Alexis Allen about self breast awareness and gave educational materials to take home. Patient did not need a Pap smear today due to last Pap smear was in 06/06/2019 per patient.  Let her know [facility] will cover Pap smears every 5 years unless has a history of abnormal Pap smears. Referred patient to the [facility] for diagnostic mammogram. Appointment scheduled for 10/25/22. Patient aware of appointment and will be there. Let patient know will follow up with her within the next couple weeks with results. Alexis Allen verbalized understanding.  Pascal Lux, NP 3:01 PM

## 2022-11-21 IMAGING — MG MM DIGITAL SCREENING BILAT W/ TOMO AND CAD
8 series · 8 of 24 positions shown · non-contrast
Comparison: Previous exam(s).

CLINICAL DATA: Screening.

EXAM:
DIGITAL SCREENING BILATERAL MAMMOGRAM WITH TOMOSYNTHESIS AND CAD
TECHNIQUE: Bilateral screening digital craniocaudal and mediolateral oblique
mammograms were obtained. Bilateral screening digital breast
tomosynthesis was performed. The images were evaluated with
computer-aided detection.

[R CC synth-2D]
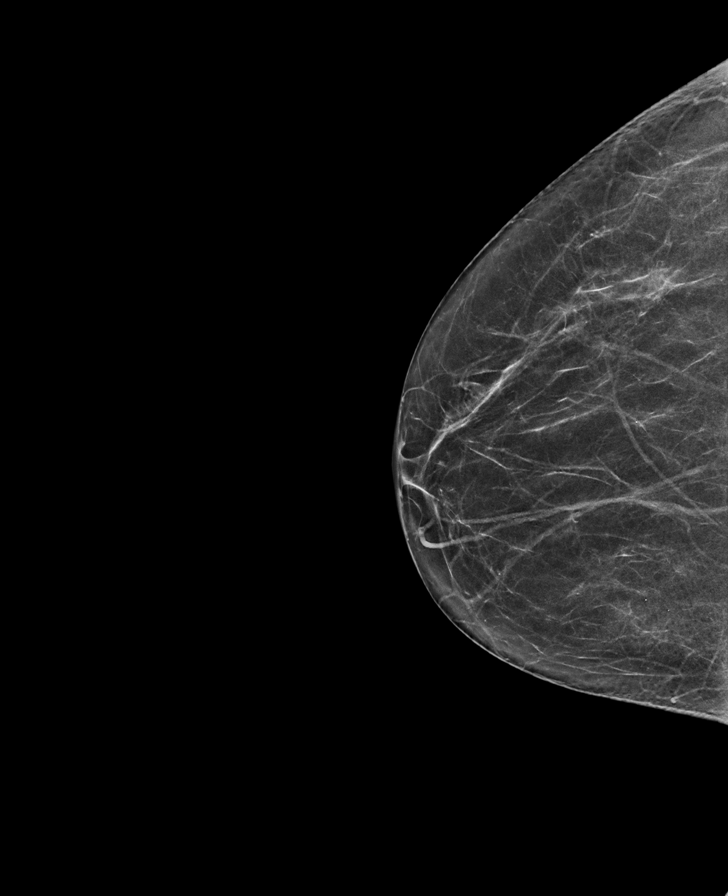

[R MLO synth-2D]
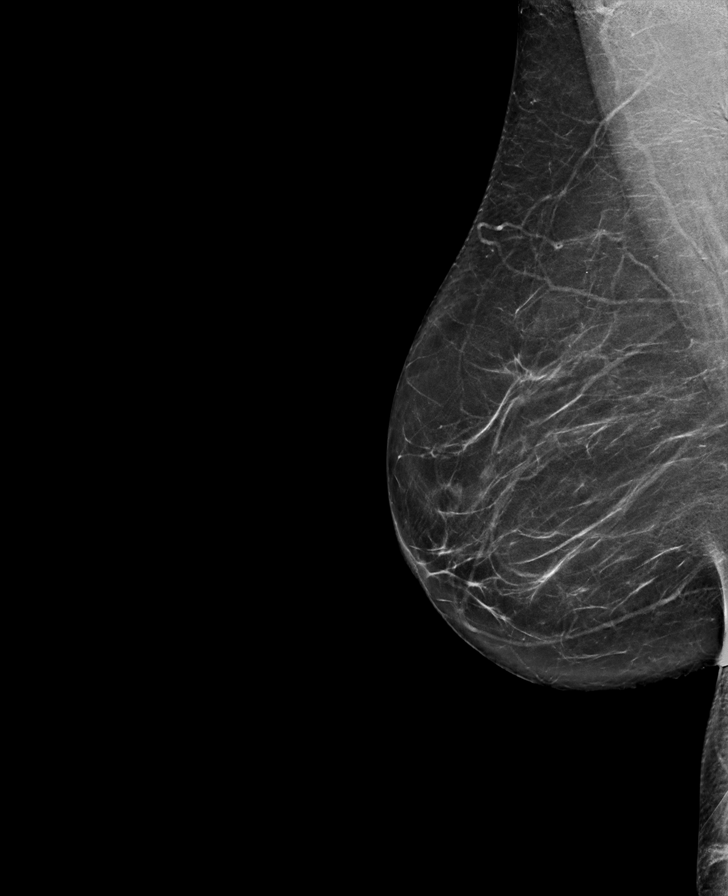

[L MLO synth-2D]
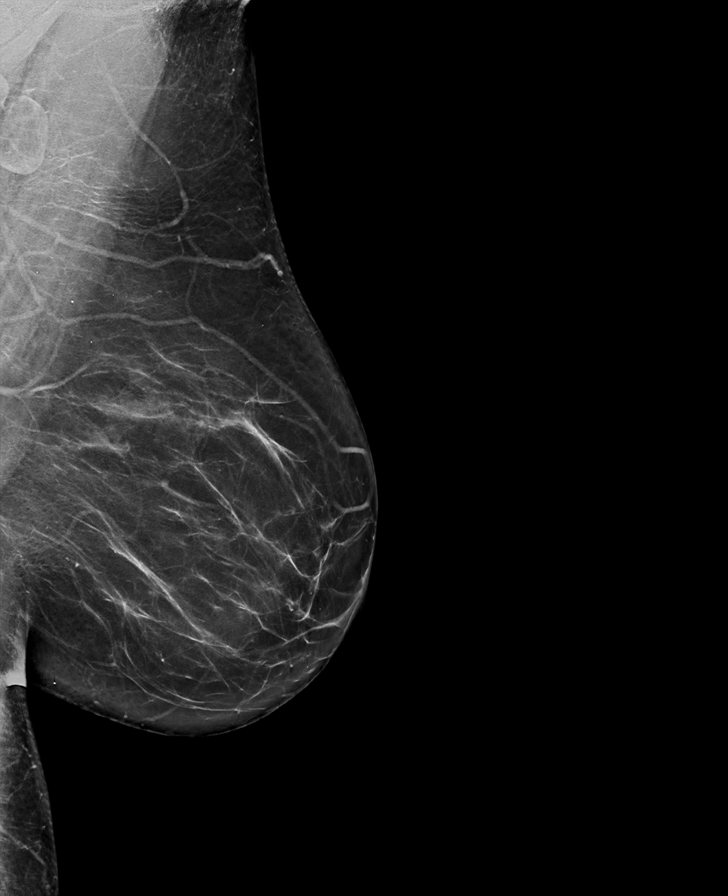

[L CC synth-2D]
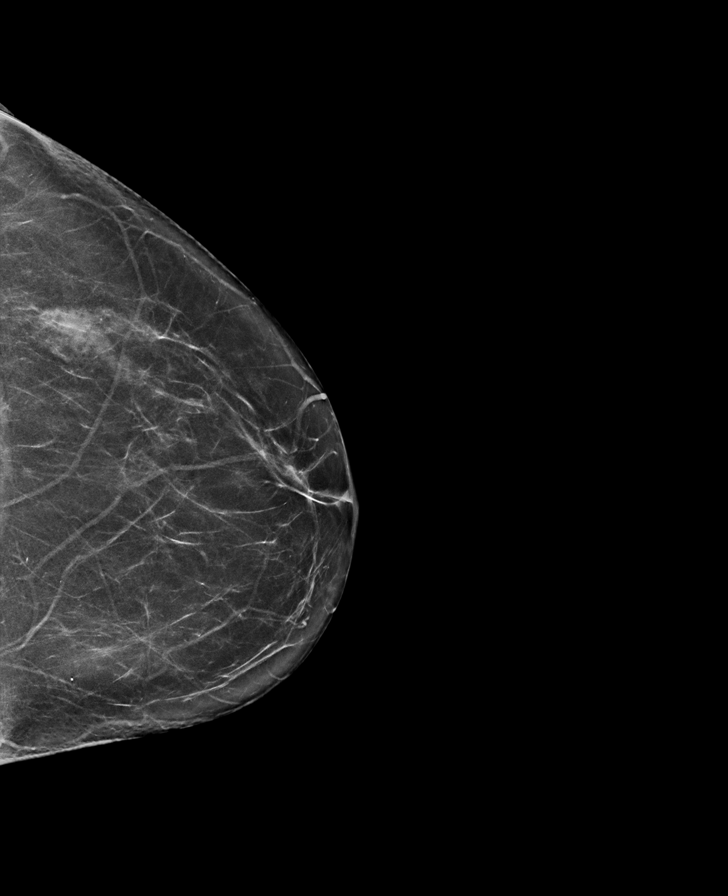

[R CC tomo · tomo slice 29/58.0]
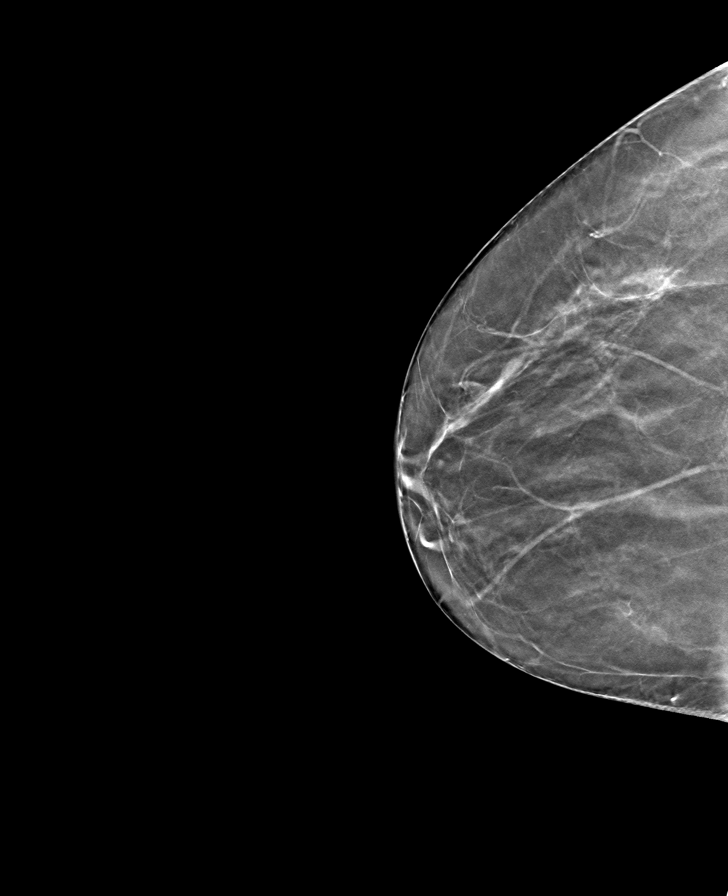

[L CC tomo · tomo slice 32/63.0]
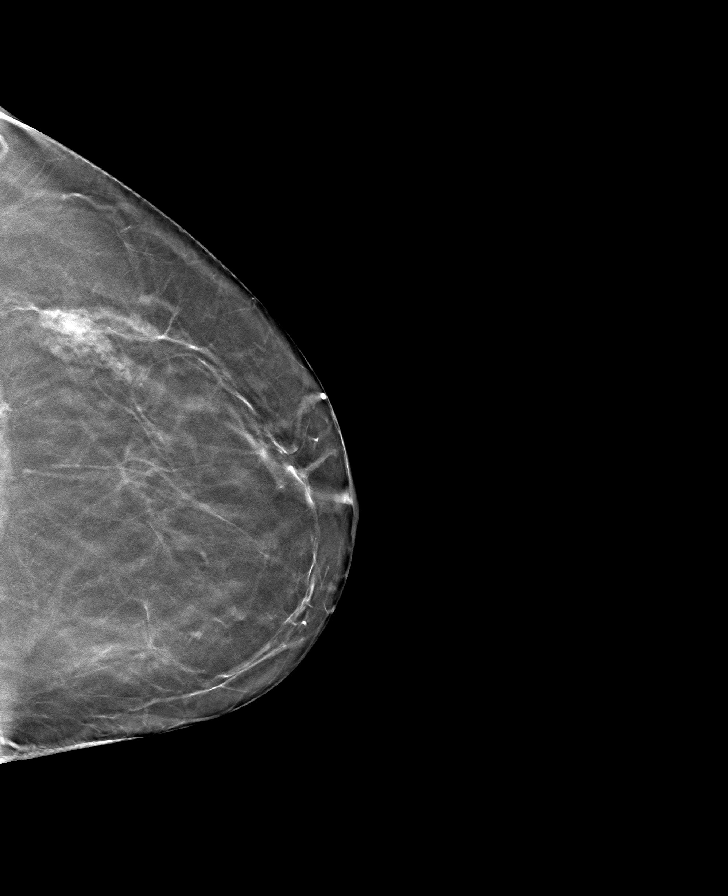

[L MLO tomo · tomo slice 37/73.0]
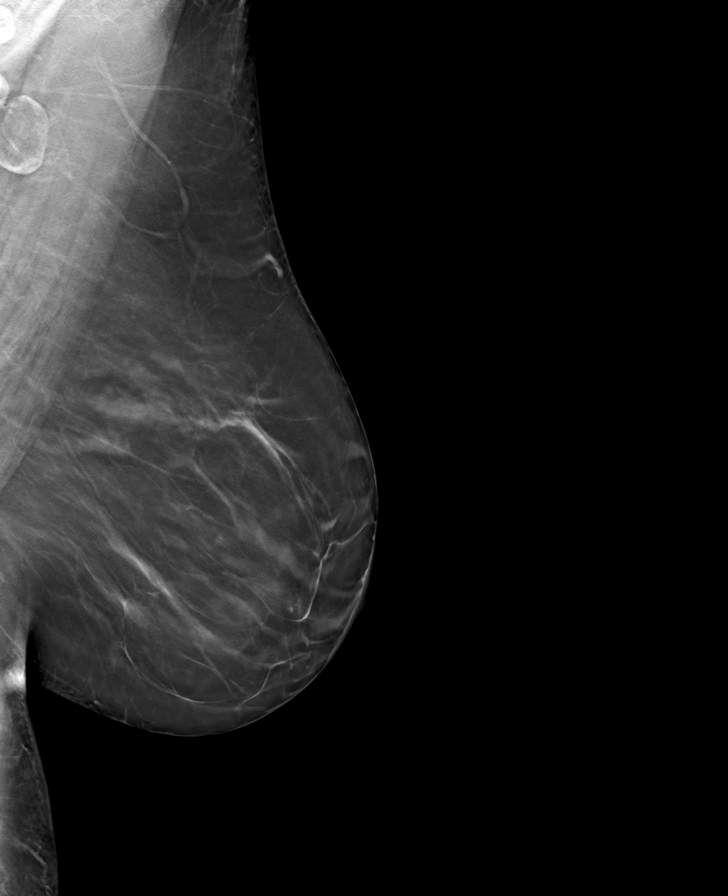

[R MLO tomo · tomo slice 33/66.0]
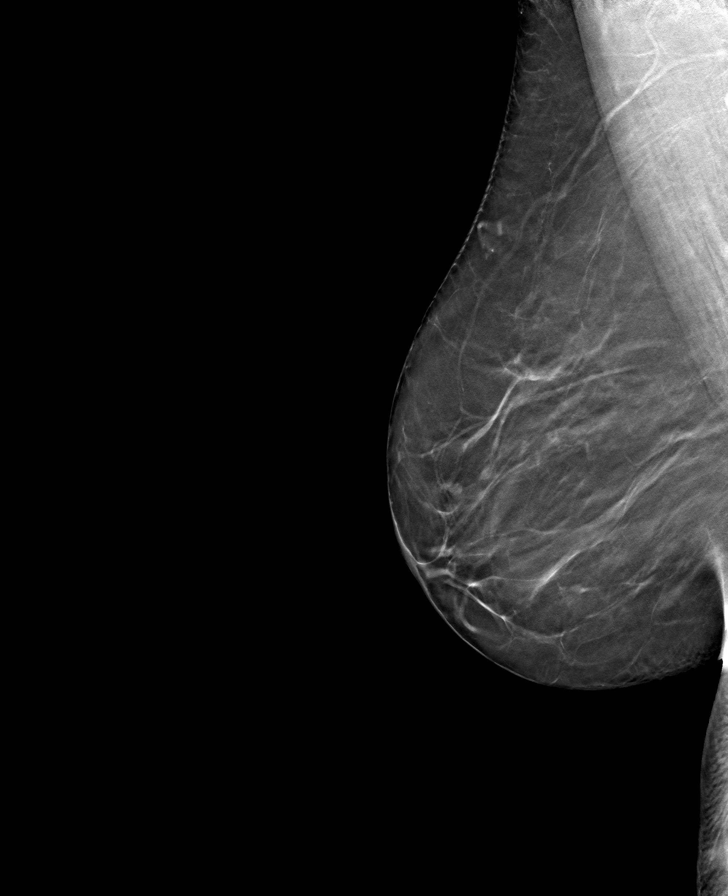

[8 of 24 positions shown; findings below may reference images not displayed]

ACR Breast Density Category b: There are scattered areas of
fibroglandular density.
FINDINGS: There are no findings suspicious for malignancy.
IMPRESSION: No mammographic evidence of malignancy. A result letter of this
screening mammogram will be mailed directly to the patient.

RECOMMENDATION:
Screening mammogram in one year. (Code:51-O-LD2)

BI-RADS CATEGORY  1: Negative.

## 2023-10-20 ENCOUNTER — Other Ambulatory Visit: Payer: Self-pay | Admitting: Family Medicine

## 2023-10-20 DIAGNOSIS — Z1231 Encounter for screening mammogram for malignant neoplasm of breast: Secondary | ICD-10-CM

## 2023-11-04 ENCOUNTER — Ambulatory Visit
Admission: RE | Admit: 2023-11-04 | Discharge: 2023-11-04 | Disposition: A | Discharge status: Home / Self Care | Payer: Self-pay | Source: Ambulatory Visit | Attending: Family Medicine | Admitting: Family Medicine

## 2023-11-04 DIAGNOSIS — Z1231 Encounter for screening mammogram for malignant neoplasm of breast: Secondary | ICD-10-CM | POA: Insufficient documentation
